# Patient Record
Sex: Male | Born: 1960 | Race: White | Hispanic: No | Marital: Married | Smoking: Never smoker
Health system: Southern US, Community
[De-identification: ages and names within clinical notes are randomized; demographics above are authoritative.]

## PROBLEM LIST (undated history)

## (undated) DIAGNOSIS — M25569 Pain in unspecified knee: Secondary | ICD-10-CM

## (undated) DIAGNOSIS — J3089 Other allergic rhinitis: Secondary | ICD-10-CM

## (undated) DIAGNOSIS — T4145XA Adverse effect of unspecified anesthetic, initial encounter: Secondary | ICD-10-CM

## (undated) DIAGNOSIS — M1990 Unspecified osteoarthritis, unspecified site: Secondary | ICD-10-CM

## (undated) DIAGNOSIS — T8859XA Other complications of anesthesia, initial encounter: Secondary | ICD-10-CM

## (undated) DIAGNOSIS — R51 Headache: Secondary | ICD-10-CM

## (undated) DIAGNOSIS — K219 Gastro-esophageal reflux disease without esophagitis: Secondary | ICD-10-CM

## (undated) HISTORY — PX: ESOPHAGOGASTRODUODENOSCOPY: SHX1529

## (undated) HISTORY — PX: TONSILLECTOMY: SUR1361

## (undated) HISTORY — PX: COLONOSCOPY: SHX174

## (undated) HISTORY — PX: OTHER SURGICAL HISTORY: SHX169

## (undated) HISTORY — PX: HEMORRHOID SURGERY: SHX153

---
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
## 2011-05-02 ENCOUNTER — Encounter (INDEPENDENT_AMBULATORY_CARE_PROVIDER_SITE_OTHER): Payer: Self-pay | Admitting: *Deleted

## 2011-05-23 ENCOUNTER — Ambulatory Visit (INDEPENDENT_AMBULATORY_CARE_PROVIDER_SITE_OTHER): Payer: 59 | Admitting: Internal Medicine

## 2011-05-23 ENCOUNTER — Encounter (INDEPENDENT_AMBULATORY_CARE_PROVIDER_SITE_OTHER): Payer: Self-pay | Admitting: Internal Medicine

## 2011-05-23 DIAGNOSIS — J45909 Unspecified asthma, uncomplicated: Secondary | ICD-10-CM | POA: Insufficient documentation

## 2011-05-23 DIAGNOSIS — R1310 Dysphagia, unspecified: Secondary | ICD-10-CM | POA: Insufficient documentation

## 2011-05-23 NOTE — Patient Instructions (Signed)
EGD/ED with Dr. Rehman. The risks and benefits such as perforation, bleeding, and infection were reviewed with the patient and is agreeable. 

## 2011-05-23 NOTE — Progress Notes (Signed)
Subjective:     [name], male   DOB: [birth year], 51 y.o.   MRN: [id number]  HPI Calvin Burns is a 51 yr old male today c/o dysphagia. His symptoms started within the past couple of months.  Meats and rice will lodge.  He has similar symptoms in the past. He underwent and EGD/ED by Dr Karilyn Cota 1 1/2 yrs for dysphagia and was dilated.  Appetite is good. No weight loss. No esophageal pain. No abdominal pain. No abdominal pain. Usually has a BM once a day. No melena or bright red rectal bleeding.   Review of Systems see hpi Current Outpatient Prescriptions  Medication Sig Dispense Refill  . albuterol (PROVENTIL HFA;VENTOLIN HFA) 108 (90 BASE) MCG/ACT inhaler Inhale 2 puffs into the lungs every 6 (six) hours as needed.      . Fluticasone-Salmeterol (ADVAIR) 250-50 MCG/DOSE AEPB Inhale 1 puff into the lungs every 12 (twelve) hours.       Past Medical History  Diagnosis Date  . Dysphagia   . Asthma    Past Surgical History  Procedure Date  . Hemorlroids     14 yr ago.  . Esophagogastroduodenoscopy     1 yr ago for dysphagia   History   Social History  . Marital Status: Divorced    Spouse Name: N/A    Number of Children: N/A  . Years of Education: N/A   Occupational History  . Not on file.   Social History Main Topics  . Smoking status: Never Smoker   . Smokeless tobacco: Not on file  . Alcohol Use: No     3 beer a day  . Drug Use: No  . Sexually Active: Not on file   Other Topics Concern  . Not on file   Social History Narrative  . No narrative on file   Family Status  Relation Status Death Age  . Mother Alive     good health  . Father Deceased     Esophageal cancer  . Sister Alive     Diabetes.  . Brother Alive     good health   History   Social History  . Marital Status: Divorced    Spouse Name: N/A    Number of Children: N/A  . Years of Education: N/A   Occupational History  . Not on file.   Social History Main Topics  . Smoking status:  Never Smoker   . Smokeless tobacco: Not on file  . Alcohol Use: No     3 beer a day  . Drug Use: No  . Sexually Active: Not on file   Other Topics Concern  . Not on file   Social History Narrative  . No narrative on file   No Known Allergies      Objective:   Physical Exam Filed Vitals:   05/23/11 1558  Height: 6\' 2"  (1.88 m)  Weight: 177 lb 3.2 oz (80.377 kg)  Alert and oriented. Skin warm and dry. Oral mucosa is moist.   . Sclera anicteric, conjunctivae is pink. Thyroid not enlarged. No cervical lymphadenopathy. Lungs clear. Heart regular rate and rhythm.  Abdomen is soft. Bowel sounds are positive. No hepatomegaly. No abdominal masses felt. No tenderness.  No edema to lower extremities. Patient is alert and oriented.      Assessment:    Dysphagia: solid food; Esophageal stricture needs to be ruled out.    Plan:    EGD/ED

## 2011-10-15 ENCOUNTER — Other Ambulatory Visit (INDEPENDENT_AMBULATORY_CARE_PROVIDER_SITE_OTHER): Payer: Self-pay | Admitting: *Deleted

## 2011-10-15 ENCOUNTER — Encounter (INDEPENDENT_AMBULATORY_CARE_PROVIDER_SITE_OTHER): Payer: Self-pay | Admitting: *Deleted

## 2011-10-15 DIAGNOSIS — R1310 Dysphagia, unspecified: Secondary | ICD-10-CM

## 2011-11-07 ENCOUNTER — Telehealth (INDEPENDENT_AMBULATORY_CARE_PROVIDER_SITE_OTHER): Payer: Self-pay | Admitting: *Deleted

## 2011-11-07 NOTE — Telephone Encounter (Signed)
PCP/Requesting MD: sasser  Name & DOB: Tyrail Vaca 01/16/1961     Procedure: egd/ed  Reason/Indication:  dysphagia  Has patient had this procedure before?  yes  If so, when, by whom and where?    Is there a family history of colon cancer?    Who?  What age when diagnosed?    Is patient diabetic?   no      Does patient have prosthetic heart valve?  no  Do you have a pacemaker?  no  Has patient had joint replacement within last 12 months?  no  Is patient on Coumadin, Plavix and/or Aspirin? no  Medications: see EPIC  Allergies: nkda  Medication Adjustment:   Procedure date & time: 12/06/11 at 730

## 2011-11-09 NOTE — Telephone Encounter (Signed)
agree

## 2011-11-19 ENCOUNTER — Encounter (HOSPITAL_COMMUNITY): Payer: Self-pay | Admitting: Pharmacy Technician

## 2011-12-19 ENCOUNTER — Telehealth (INDEPENDENT_AMBULATORY_CARE_PROVIDER_SITE_OTHER): Payer: Self-pay | Admitting: *Deleted

## 2011-12-19 NOTE — Telephone Encounter (Signed)
PCP/Requesting MD: sasser   Name & DOB: Calvin Burns December 14, 1960   Procedure: egd/ed  Reason/Indication:  dysphagia  Has patient had this procedure before?  yes  If so, when, by whom and where?    Is there a family history of colon cancer?    Who?  What age when diagnosed?    Is patient diabetic?   no      Does patient have prosthetic heart valve?  no  Do you have a pacemaker?  no  Has patient had joint replacement within last 12 months?  no  Is patient on Coumadin, Plavix and/or Aspirin? no  Medications: see EPIC  Allergies: nkda  Medication Adjustment:   Procedure date & time: 01/10/12 at 930

## 2011-12-20 NOTE — Telephone Encounter (Signed)
agree

## 2012-01-07 MED ORDER — BUPIVACAINE HCL (PF) 0.5 % IJ SOLN
INTRAMUSCULAR | Status: AC
  Filled 2012-01-07: qty 30

## 2012-01-09 MED ORDER — SODIUM CHLORIDE 0.45 % IV SOLN
Freq: Once | INTRAVENOUS | Status: AC
  Administered 2012-01-10: 09:00:00 via INTRAVENOUS

## 2012-01-10 ENCOUNTER — Encounter (HOSPITAL_COMMUNITY): Payer: Self-pay | Admitting: *Deleted

## 2012-01-10 ENCOUNTER — Encounter (HOSPITAL_COMMUNITY)
Admission: RE | Disposition: A | Discharge status: Home / Self Care | Payer: Self-pay | Source: Ambulatory Visit | Attending: Internal Medicine

## 2012-01-10 ENCOUNTER — Ambulatory Visit (HOSPITAL_COMMUNITY)
Admission: RE | Admit: 2012-01-10 | Discharge: 2012-01-10 | Disposition: A | Discharge status: Home / Self Care | Payer: 59 | Source: Ambulatory Visit | Attending: Internal Medicine | Admitting: Internal Medicine

## 2012-01-10 DIAGNOSIS — B3781 Candidal esophagitis: Secondary | ICD-10-CM | POA: Insufficient documentation

## 2012-01-10 DIAGNOSIS — R1310 Dysphagia, unspecified: Secondary | ICD-10-CM

## 2012-01-10 DIAGNOSIS — K449 Diaphragmatic hernia without obstruction or gangrene: Secondary | ICD-10-CM | POA: Insufficient documentation

## 2012-01-10 DIAGNOSIS — K228 Other specified diseases of esophagus: Secondary | ICD-10-CM

## 2012-01-10 DIAGNOSIS — K208 Other esophagitis: Secondary | ICD-10-CM

## 2012-01-10 DIAGNOSIS — K222 Esophageal obstruction: Secondary | ICD-10-CM

## 2012-01-10 DIAGNOSIS — R12 Heartburn: Secondary | ICD-10-CM

## 2012-01-10 HISTORY — PX: ESOPHAGOGASTRODUODENOSCOPY (EGD) WITH ESOPHAGEAL DILATION: SHX5812

## 2012-01-10 HISTORY — DX: Headache: R51

## 2012-01-10 SURGERY — ESOPHAGOGASTRODUODENOSCOPY (EGD) WITH ESOPHAGEAL DILATION
Anesthesia: Moderate Sedation

## 2012-01-10 MED ORDER — BUTAMBEN-TETRACAINE-BENZOCAINE 2-2-14 % EX AERO
INHALATION_SPRAY | CUTANEOUS | Status: DC | PRN
  Administered 2012-01-10: 2 via TOPICAL

## 2012-01-10 MED ORDER — PANTOPRAZOLE SODIUM 40 MG PO TBEC: 40.0000 mg | DELAYED_RELEASE_TABLET | Freq: Two times a day (BID) | ORAL | Status: DC

## 2012-01-10 MED ORDER — MEPERIDINE HCL 50 MG/ML IJ SOLN
INTRAMUSCULAR | Status: AC
  Filled 2012-01-10: qty 1

## 2012-01-10 MED ORDER — STERILE WATER FOR IRRIGATION IR SOLN
Status: DC | PRN
  Administered 2012-01-10: 09:00:00

## 2012-01-10 MED ORDER — MIDAZOLAM HCL 5 MG/5ML IJ SOLN
INTRAMUSCULAR | Status: AC
  Filled 2012-01-10: qty 10

## 2012-01-10 MED ORDER — MIDAZOLAM HCL 5 MG/5ML IJ SOLN
INTRAMUSCULAR | Status: DC | PRN
  Administered 2012-01-10 (×4): 2 mg via INTRAVENOUS
  Administered 2012-01-10 (×2): 1 mg via INTRAVENOUS

## 2012-01-10 MED ORDER — NYSTATIN NICU ORAL SYRINGE 100,000 UNITS/ML: 5.0000 mL | Freq: Four times a day (QID) | OROMUCOSAL | Status: DC

## 2012-01-10 MED ORDER — MEPERIDINE HCL 25 MG/ML IJ SOLN
INTRAMUSCULAR | Status: DC | PRN
  Administered 2012-01-10 (×2): 25 mg via INTRAVENOUS

## 2012-01-10 NOTE — H&P (Signed)
Calvin Burns is an 51 y.o. male.   Chief Complaint: Patient is here for EGD and ED. HPI: Patient is 51 year old Caucasian male who presents with few months history of solid food dysphagia. He has history of erosive esophagitis and esophageal stricture. Stricture was last dilated in June 2011 to 16.5 mm. He has not been taking his PPI. He is having heartburn couple of times a week. He denies anorexia weight loss abdominal pain or melena.  Past Medical History  Diagnosis Date  . Dysphagia   . Asthma   . Headache     History of migraines    Past Surgical History  Procedure Date  . Hemorlroids     14 yr ago.  . Esophagogastroduodenoscopy     1 yr ago for dysphagia  . Hemorrhoid surgery   . Colonoscopy     X 2    Family History  Problem Relation Age of Onset  . Colon cancer Other    Social History:  reports that he has never smoked. He does not have any smokeless tobacco history on file. He reports that he drinks about 16.8 ounces of alcohol per week. He reports that he does not use illicit drugs.  Allergies: No Known Allergies  Medications Prior to Admission  Medication Sig Dispense Refill  . Fluticasone-Salmeterol (ADVAIR) 250-50 MCG/DOSE AEPB Inhale 1 puff into the lungs every 12 (twelve) hours.      . naproxen (NAPROSYN) 500 MG tablet Take 500 mg by mouth daily as needed. Pain      . naproxen sodium (ALEVE) 220 MG tablet Take 220 mg by mouth 2 (two) times daily as needed. Pain      . albuterol (PROVENTIL HFA;VENTOLIN HFA) 108 (90 BASE) MCG/ACT inhaler Inhale 2 puffs into the lungs every 6 (six) hours as needed. Shortness of Breath        No results found for this or any previous visit (from the past 48 hour(s)). No results found.  ROS  Blood pressure 138/94, pulse 72, temperature 97.6 F (36.4 C), temperature source Oral, resp. rate 13, height 6\' 2"  (1.88 m), weight 177 lb (80.287 kg), SpO2 98.00%. Physical Exam  Constitutional: He appears well-developed and  well-nourished.  HENT:  Mouth/Throat: Oropharynx is clear and moist.  Eyes: Conjunctivae normal are normal. No scleral icterus.  Neck: No thyromegaly present.  Cardiovascular: Normal rate, regular rhythm and normal heart sounds.   No murmur heard. Respiratory: Effort normal and breath sounds normal.  GI: Soft. He exhibits no distension and no mass. There is no tenderness.  Musculoskeletal: He exhibits no edema.  Lymphadenopathy:    He has no cervical adenopathy.  Neurological: He is alert.  Skin: Skin is warm and dry.     Assessment/Plan Dysphagia and patient with chronic GERD and esophageal stricture. EGD with ED.  REHMAN,NAJEEB U 01/10/2012, 9:27 AM

## 2012-01-10 NOTE — Op Note (Signed)
EGD PROCEDURE REPORT  PATIENT:  Calvin Burns  MR#:  [id number] Birthdate:  May 06, 1960, 51 y.o., male Endoscopist:  Dr. Malissa Hippo, MD Referred By:  Dr. Estanislado Pandy, MD Procedure Date: 01/10/2012  Procedure:   EGD with ED.  Indications:  Patient is 51 year old Caucasian male who presents with solid food dysphagia and frequent heartburn. He has history of erosive esophagitis and distal esophageal stricture which was last dilated in June 2011 at [facility] to 16.5 mm. He has not been taking  PPI until recent office visit.            Informed Consent:  The risks, benefits, alternatives & imponderables which include, but are not limited to, bleeding, infection, perforation, drug reaction and potential missed lesion have been reviewed.  The potential for biopsy, lesion removal, esophageal dilation, etc. have also been discussed.  Questions have been answered.  All parties agreeable.  Please see history & physical in medical record for more information.  Medications:  Demerol 50 mg IV Versed 10 mg IV Cetacaine spray topically for oropharyngeal anesthesia  Description of procedure:  The endoscope was introduced through the mouth and advanced to the second portion of the duodenum without difficulty or limitations. The mucosal surfaces were surveyed very carefully during advancement of the scope and upon withdrawal.  Findings:  Esophagus: Patchy cheesy exudate noted on the mucosa of proximal and middle segment. In the mid and distal esophagus were circumferential rings without critical narrowing. No erosions identified. Stricture noted at GE junction. I was able to pass scope across it without any difficulty. GEJ:  38cm Hiatus:  41 cm Stomach:  Stomach was empty and distended very well with insufflation. Folds in the proximal stomach were normal. Examination of mucosa at body, antrum, pyloric channel as well as angularis, fundus and cardia was normal. Duodenum:  Normal bulbar and post bulbar  mucosa.  Therapeutic/Diagnostic Maneuvers Performed:  Distal esophageal stricture was dilated with a balloon dilator. Lumen dilator was passed with the scope. Guidewire was pushed into gastric lumen. Balloon dilator was positioned across esophageal stricture and slowly insufflated to a diameter of 15.5 and then to 16.5 and finally to 18 mm resulting in mucosal disruption. On the way out a biopsy was taken from esophageal mucosa looking for changes of eosinophilic esophagitis.  Complications:  None  Impression: Mild changes of Candida esophagitis. Circumferential rings at esophageal body without narrowing suspicious for eosinophilic esophagitis. Biopsy taken post dilation. Stricture at GEJ dilated with  balloon from 15 to74mm. Small sliding hiatal hernia.  Recommendations:  Soft diet for 24 hours. Pantoprazole 40 mg by mouth twice a day. Mycostatin suspension 500,000 units swish and swallow 4 times a day for 2 weeks  Silvio Sausedo U  01/10/2012  10:00 AM  CC: Dr. Estanislado Pandy, MD & Dr. Bonnetta Barry ref. provider found

## 2012-07-22 ENCOUNTER — Ambulatory Visit (INDEPENDENT_AMBULATORY_CARE_PROVIDER_SITE_OTHER): Payer: 59 | Admitting: Urology

## 2012-07-22 DIAGNOSIS — N4601 Organic azoospermia: Secondary | ICD-10-CM

## 2012-11-03 ENCOUNTER — Other Ambulatory Visit (INDEPENDENT_AMBULATORY_CARE_PROVIDER_SITE_OTHER): Payer: Self-pay | Admitting: Internal Medicine

## 2012-11-03 DIAGNOSIS — K219 Gastro-esophageal reflux disease without esophagitis: Secondary | ICD-10-CM

## 2012-11-03 MED ORDER — PANTOPRAZOLE SODIUM 40 MG PO TBEC: 40.0000 mg | DELAYED_RELEASE_TABLET | Freq: Every day | ORAL | Status: DC

## 2012-11-06 ENCOUNTER — Other Ambulatory Visit (INDEPENDENT_AMBULATORY_CARE_PROVIDER_SITE_OTHER): Payer: Self-pay | Admitting: Internal Medicine

## 2012-11-06 DIAGNOSIS — K219 Gastro-esophageal reflux disease without esophagitis: Secondary | ICD-10-CM

## 2012-11-06 MED ORDER — PANTOPRAZOLE SODIUM 40 MG PO TBEC: 40.0000 mg | DELAYED_RELEASE_TABLET | Freq: Every day | ORAL | Status: DC

## 2012-11-18 ENCOUNTER — Other Ambulatory Visit (INDEPENDENT_AMBULATORY_CARE_PROVIDER_SITE_OTHER): Payer: Self-pay | Admitting: Internal Medicine

## 2012-11-18 DIAGNOSIS — K219 Gastro-esophageal reflux disease without esophagitis: Secondary | ICD-10-CM

## 2012-11-18 MED ORDER — PANTOPRAZOLE SODIUM 40 MG PO TBEC: 40.0000 mg | DELAYED_RELEASE_TABLET | Freq: Every day | ORAL | Status: DC

## 2012-11-24 ENCOUNTER — Other Ambulatory Visit (INDEPENDENT_AMBULATORY_CARE_PROVIDER_SITE_OTHER): Payer: Self-pay | Admitting: Internal Medicine

## 2012-11-24 DIAGNOSIS — K219 Gastro-esophageal reflux disease without esophagitis: Secondary | ICD-10-CM

## 2013-02-04 ENCOUNTER — Encounter (INDEPENDENT_AMBULATORY_CARE_PROVIDER_SITE_OTHER): Payer: Self-pay | Admitting: *Deleted

## 2013-04-23 ENCOUNTER — Other Ambulatory Visit (INDEPENDENT_AMBULATORY_CARE_PROVIDER_SITE_OTHER): Payer: Self-pay | Admitting: Internal Medicine

## 2013-04-23 DIAGNOSIS — K219 Gastro-esophageal reflux disease without esophagitis: Secondary | ICD-10-CM

## 2013-04-23 MED ORDER — PANTOPRAZOLE SODIUM 40 MG PO TBEC: 40.0000 mg | DELAYED_RELEASE_TABLET | Freq: Every day | ORAL | Status: DC

## 2013-04-27 ENCOUNTER — Telehealth (INDEPENDENT_AMBULATORY_CARE_PROVIDER_SITE_OTHER): Payer: Self-pay | Admitting: Internal Medicine

## 2013-04-27 DIAGNOSIS — K219 Gastro-esophageal reflux disease without esophagitis: Secondary | ICD-10-CM

## 2013-04-27 MED ORDER — PANTOPRAZOLE SODIUM 40 MG PO TBEC: 40.0000 mg | DELAYED_RELEASE_TABLET | Freq: Every day | ORAL | Status: DC

## 2013-04-27 NOTE — Telephone Encounter (Signed)
Eprescribed.

## 2013-05-25 ENCOUNTER — Other Ambulatory Visit (INDEPENDENT_AMBULATORY_CARE_PROVIDER_SITE_OTHER): Payer: Self-pay | Admitting: Internal Medicine

## 2013-05-25 DIAGNOSIS — K219 Gastro-esophageal reflux disease without esophagitis: Secondary | ICD-10-CM

## 2013-05-25 MED ORDER — PANTOPRAZOLE SODIUM 40 MG PO TBEC: 40.0000 mg | DELAYED_RELEASE_TABLET | Freq: Every day | ORAL | Status: DC

## 2013-06-23 ENCOUNTER — Ambulatory Visit (INDEPENDENT_AMBULATORY_CARE_PROVIDER_SITE_OTHER): Payer: 59 | Admitting: Internal Medicine

## 2013-07-22 ENCOUNTER — Encounter (INDEPENDENT_AMBULATORY_CARE_PROVIDER_SITE_OTHER): Payer: Self-pay | Admitting: Internal Medicine

## 2013-07-22 ENCOUNTER — Ambulatory Visit (INDEPENDENT_AMBULATORY_CARE_PROVIDER_SITE_OTHER): Payer: 59 | Admitting: Internal Medicine

## 2013-07-22 VITALS — BP 108/72 | HR 76 | Temp 98.3°F | Ht 74.0 in | Wt 175.1 lb

## 2013-07-22 DIAGNOSIS — K219 Gastro-esophageal reflux disease without esophagitis: Secondary | ICD-10-CM

## 2013-07-22 MED ORDER — PANTOPRAZOLE SODIUM 40 MG PO TBEC: 40.0000 mg | DELAYED_RELEASE_TABLET | Freq: Every day | ORAL | Status: DC

## 2013-07-22 NOTE — Patient Instructions (Signed)
OV in 1 yr. Continue the Protonix 

## 2013-07-22 NOTE — Progress Notes (Signed)
Subjective:     [name], male   DOB: 1960/04/16, 53 y.o.   MRN: [id number]  HPI Here today for f/u. He tells me he is doing good. He takes his Protonix daily. Protonix is controlling his acid reflux. Appetite is good. No weight loss. No dysphagia. No abdominal pain. BMs are normal. No melena or bright red rectal bleeding.  01/10/2012 EGD/ED:  Impression:  Mild changes of Candida esophagitis.  Circumferential rings at esophageal body without narrowing suspicious for eosinophilic esophagitis. Biopsy taken post dilation.  Stricture at GEJ dilated with balloon from 15 to31mm.  Small sliding hiatal hernia.     Review of Systems see hpi  Past Medical History  Diagnosis Date  . Dysphagia   . Asthma   . Headache(784.0)     History of migraines    Past Surgical History  Procedure Laterality Date  . Hemorlroids      14 yr ago.  . Esophagogastroduodenoscopy      1 yr ago for dysphagia  . Hemorrhoid surgery    . Colonoscopy      X 2  . Esophagogastroduodenoscopy (egd) with esophageal dilation  01/10/2012    Procedure: ESOPHAGOGASTRODUODENOSCOPY (EGD) WITH ESOPHAGEAL DILATION;  Surgeon: Rogene Houston, MD;  Location: [facility];  Service: Endoscopy;  Laterality: N/A;  930    No Known Allergies  Current Outpatient Prescriptions on File Prior to Visit  Medication Sig Dispense Refill  . albuterol (PROVENTIL HFA;VENTOLIN HFA) 108 (90 BASE) MCG/ACT inhaler Inhale 2 puffs into the lungs every 6 (six) hours as needed. Shortness of Breath      . Fluticasone-Salmeterol (ADVAIR) 250-50 MCG/DOSE AEPB Inhale 1 puff into the lungs every 12 (twelve) hours.      . naproxen (NAPROSYN) 500 MG tablet Take 500 mg by mouth daily as needed. Pain       No current facility-administered medications on file prior to visit.        Objective:   Physical Exam  Filed Vitals:   07/22/13 1535  BP: 108/72  Pulse: 76  Temp: 98.3 F (36.8 C)  Height: 6\' 2"  (1.88 m)  Weight: 175  lb 1.6 oz (79.425 kg)   Alert and oriented. Skin warm and dry. Oral mucosa is moist.   . Sclera anicteric, conjunctivae is pink. Thyroid not enlarged. No cervical lymphadenopathy. Lungs clear. Heart regular rate and rhythm.  Abdomen is soft. Bowel sounds are positive. No hepatomegaly. No abdominal masses felt. No tenderness.  No edema to lower extremities. Patient is alert and oriented.     Assessment:    GERD. Controlled at this time. No dysphagia.     Plan:    OV in 1 yr. Continue the Protonix.

## 2014-04-22 ENCOUNTER — Encounter (INDEPENDENT_AMBULATORY_CARE_PROVIDER_SITE_OTHER): Payer: Self-pay | Admitting: *Deleted

## 2014-07-26 ENCOUNTER — Ambulatory Visit (INDEPENDENT_AMBULATORY_CARE_PROVIDER_SITE_OTHER): Payer: 59 | Admitting: Internal Medicine

## 2014-07-26 ENCOUNTER — Encounter (INDEPENDENT_AMBULATORY_CARE_PROVIDER_SITE_OTHER): Payer: Self-pay | Admitting: Internal Medicine

## 2014-07-26 VITALS — BP 110/82 | HR 78 | Temp 98.9°F | Resp 18 | Ht 74.0 in | Wt 175.1 lb

## 2014-07-26 DIAGNOSIS — K2100 Gastro-esophageal reflux disease with esophagitis, without bleeding: Secondary | ICD-10-CM

## 2014-07-26 DIAGNOSIS — Z8719 Personal history of other diseases of the digestive system: Secondary | ICD-10-CM

## 2014-07-26 MED ORDER — PANTOPRAZOLE SODIUM 40 MG PO TBEC: 40.0000 mg | DELAYED_RELEASE_TABLET | Freq: Every day | ORAL | Status: DC

## 2014-07-26 NOTE — Patient Instructions (Signed)
Call if you have swallowing difficulty or pantoprazole quits working.

## 2014-07-26 NOTE — Progress Notes (Signed)
Presenting complaint;  Follow-up for chronic GERD.  Subjective:  Patient is 54 year old Caucasian male who is here for yearly visit. He has history of erosive reflux esophagitis complicated by esophageal stricture who has undergone esophageal dilation in 2011 and again in October 2013. He states he's feeling fine. He has no dysphagia whatsoever. He eats slowly and chews his food thoroughly. He also does not have heartburn as long as he takes his medication. However if he skips a dose he starts having heartburn and regurgitation within 36 hours. His bowels move daily. He denies melena or rectal bleeding. He he works in his yard and [employer]. His last colonoscopy was in June 2010.  Family history significant for esophageal carcinoma in his father who was 59 at the time of diagnosis. Maternal grandmother was diagnosed with colon carcinoma at age 50 and died at 43.    Current Medications: Outpatient Encounter Prescriptions as of 07/26/2014  Medication Sig  . albuterol (PROVENTIL HFA;VENTOLIN HFA) 108 (90 BASE) MCG/ACT inhaler Inhale 2 puffs into the lungs every 6 (six) hours as needed. Shortness of Breath  . Fluticasone-Salmeterol (ADVAIR) 250-50 MCG/DOSE AEPB Inhale 1 puff into the lungs every 12 (twelve) hours.  . naproxen (NAPROSYN) 500 MG tablet Take 500 mg by mouth daily as needed. Pain  . pantoprazole (PROTONIX) 40 MG tablet Take 1 tablet (40 mg total) by mouth daily. Once a day before breakfast.   No facility-administered encounter medications on file as of 07/26/2014.     Objective: Blood pressure 110/82, pulse 78, temperature 98.9 F (37.2 C), temperature source Oral, resp. rate 18, height 6\' 2"  (1.88 m), weight 175 lb 1.6 oz (79.425 kg).  patient is alert and in no acute distress.  Conjunctiva is pink. Sclera is nonicteric Oropharyngeal mucosa is normal. No neck masses or thyromegaly noted. Cardiac exam with regular rhythm normal S1 and S2. No murmur or gallop  noted. Lungs are clear to auscultation. Abdomen is symmetrical soft and nontender without organomegaly or masses.   No LE edema or clubbing noted.    Assessment:  #1. Chronic GERD complicated by distal esophageal stricture which has been dilated in 2011 and more recently in October 2013. He is doing very well with therapy. He is not having dysphagia at all. #2. CRC risk deemed to be average. X colonoscopy in 2020.  Plan:  New prescription given for pantoprazole 40 mg by mouth every morning 90 doses with 3 refills. Office visit in one year unless pantoprazole stops working or he develops dysphagia.

## 2015-04-08 ENCOUNTER — Encounter (INDEPENDENT_AMBULATORY_CARE_PROVIDER_SITE_OTHER): Payer: Self-pay | Admitting: Internal Medicine

## 2015-07-28 ENCOUNTER — Ambulatory Visit (INDEPENDENT_AMBULATORY_CARE_PROVIDER_SITE_OTHER): Payer: 59 | Admitting: Internal Medicine

## 2015-09-07 ENCOUNTER — Encounter (INDEPENDENT_AMBULATORY_CARE_PROVIDER_SITE_OTHER): Payer: Self-pay | Admitting: Internal Medicine

## 2015-09-07 ENCOUNTER — Ambulatory Visit (INDEPENDENT_AMBULATORY_CARE_PROVIDER_SITE_OTHER): Payer: 59 | Admitting: Internal Medicine

## 2015-09-07 VITALS — BP 130/86 | HR 72 | Temp 98.1°F | Ht 74.0 in | Wt 175.9 lb

## 2015-09-07 DIAGNOSIS — K2100 Gastro-esophageal reflux disease with esophagitis, without bleeding: Secondary | ICD-10-CM

## 2015-09-07 MED ORDER — PANTOPRAZOLE SODIUM 40 MG PO TBEC: 40.0000 mg | DELAYED_RELEASE_TABLET | Freq: Every day | ORAL | Status: DC

## 2015-09-07 NOTE — Progress Notes (Signed)
   Subjective:    [name], male    DOB: 10-22-1960, 55 y.o.   MRN: [id number] F/u GERD. HPI Here today for f/u. He was last seen in May of last year by Dr. Laural Golden. Hx of erosive reflux esophagitis complicated by esophageal stricture. Underwent and ED in 2011 and 2013. Last colonoscopy in 2010.  Family hx significant for esophageal carcinoma in father who was 1 at time of diagnosis.  Maternal grandmother was diagnosed with colon cancer at age 43 and died at age 82. 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 He takes the Protonix daily. No dysphagia. Appetite is good. No weight loss.  BMs are nomal. No melena or BRRB.       Review of Systems Past Medical History  Diagnosis Date  . Dysphagia   . Asthma   . Headache(784.0)     History of migraines    Past Surgical History  Procedure Laterality Date  . Hemorlroids      14 yr ago.  . Esophagogastroduodenoscopy      1 yr ago for dysphagia  . Hemorrhoid surgery    . Colonoscopy      X 2  . Esophagogastroduodenoscopy (egd) with esophageal dilation  01/10/2012    Procedure: ESOPHAGOGASTRODUODENOSCOPY (EGD) WITH ESOPHAGEAL DILATION;  Surgeon: Rogene Houston, MD;  Location: [facility];  Service: Endoscopy;  Laterality: N/A;  930    No Known Allergies  Current Outpatient Prescriptions on File Prior to Visit  Medication Sig Dispense Refill  . albuterol (PROVENTIL HFA;VENTOLIN HFA) 108 (90 BASE) MCG/ACT inhaler Inhale 2 puffs into the lungs every 6 (six) hours as needed. Shortness of Breath    . Fluticasone-Salmeterol (ADVAIR) 250-50 MCG/DOSE AEPB Inhale 1 puff into the lungs every 12 (twelve) hours.    . naproxen (NAPROSYN) 500 MG tablet Take 500 mg by mouth daily as needed. Pain     No current facility-administered medications on file prior to visit.        Objective:   Physical ExamBlood pressure 130/86, pulse 72, temperature 98.1 F (36.7 C), height 6\' 2"  (1.88 m), weight 175 lb 14.4 oz (79.788 kg).  Alert and oriented.  Skin warm and dry. Oral mucosa is moist.   . Sclera anicteric, conjunctivae is pink. Thyroid not enlarged. No cervical lymphadenopathy. Lungs clear. Heart regular rate and rhythm.  Abdomen is soft. Bowel sounds are positive. No hepatomegaly. No abdominal masses felt. No tenderness.  No edema to lower extremities. P        Assessment & Plan:  GERD. He is doing well with therapy. He will continue Protonix. No dysphagia. Rx for Protonix sent to his pharmacy.  OV in 1 year. He will let me know when he is ready for a colonoscopy.

## 2015-09-07 NOTE — Patient Instructions (Signed)
Continue the Protonix. OV in 1 year.  

## 2016-08-13 ENCOUNTER — Encounter (INDEPENDENT_AMBULATORY_CARE_PROVIDER_SITE_OTHER): Payer: Self-pay | Admitting: Internal Medicine

## 2016-08-13 ENCOUNTER — Encounter (INDEPENDENT_AMBULATORY_CARE_PROVIDER_SITE_OTHER): Payer: Self-pay

## 2016-09-06 ENCOUNTER — Encounter (INDEPENDENT_AMBULATORY_CARE_PROVIDER_SITE_OTHER): Payer: Self-pay | Admitting: *Deleted

## 2016-09-06 ENCOUNTER — Telehealth (INDEPENDENT_AMBULATORY_CARE_PROVIDER_SITE_OTHER): Payer: Self-pay | Admitting: *Deleted

## 2016-09-06 ENCOUNTER — Ambulatory Visit (INDEPENDENT_AMBULATORY_CARE_PROVIDER_SITE_OTHER): Payer: 59 | Admitting: Internal Medicine

## 2016-09-06 ENCOUNTER — Encounter (INDEPENDENT_AMBULATORY_CARE_PROVIDER_SITE_OTHER): Payer: Self-pay | Admitting: Internal Medicine

## 2016-09-06 ENCOUNTER — Other Ambulatory Visit (INDEPENDENT_AMBULATORY_CARE_PROVIDER_SITE_OTHER): Payer: Self-pay | Admitting: Internal Medicine

## 2016-09-06 VITALS — BP 130/80 | HR 64 | Temp 98.3°F | Ht 73.0 in | Wt 169.0 lb

## 2016-09-06 DIAGNOSIS — R1310 Dysphagia, unspecified: Secondary | ICD-10-CM | POA: Diagnosis not present

## 2016-09-06 DIAGNOSIS — R1319 Other dysphagia: Secondary | ICD-10-CM

## 2016-09-06 DIAGNOSIS — K2100 Gastro-esophageal reflux disease with esophagitis, without bleeding: Secondary | ICD-10-CM

## 2016-09-06 DIAGNOSIS — Z800 Family history of malignant neoplasm of digestive organs: Secondary | ICD-10-CM | POA: Insufficient documentation

## 2016-09-06 MED ORDER — PEG 3350-KCL-NA BICARB-NACL 420 G PO SOLR: 4000.0000 mL | Freq: Once | ORAL | 0 refills | Status: AC

## 2016-09-06 NOTE — Progress Notes (Signed)
   Subjective:    [name], male    DOB: [birth year], 56 y.o.   MRN: [id number]  HPI  Her today for follow. Last seen in June of 2017 for f/u. Hx of erosive reflux esophagitis complicated by esophageal stricture. EGD/ED in 2011 and 2013.   Last colonoscopy in 2010.  Maternal grandmother diagnosed with colon cancer ant age 38 and died at age 39.  He tells me he is doing good. His acid reflux controlled with Protonix. No dysphagia. He has a BM daily. No melena or BRRb. Due for a colonoscopy.    Review of Systems Past Medical History:  Diagnosis Date  . Asthma   . Dysphagia   . Headache(784.0)    History of migraines    Past Surgical History:  Procedure Laterality Date  . COLONOSCOPY     X 2  . ESOPHAGOGASTRODUODENOSCOPY     1 yr ago for dysphagia  . ESOPHAGOGASTRODUODENOSCOPY (EGD) WITH ESOPHAGEAL DILATION  01/10/2012   Procedure: ESOPHAGOGASTRODUODENOSCOPY (EGD) WITH ESOPHAGEAL DILATION;  Surgeon: Rogene Houston, MD;  Location: [facility];  Service: Endoscopy;  Laterality: N/A;  930  . Hemorlroids     14 yr ago.  Marland Kitchen HEMORRHOID SURGERY      No Known Allergies  Current Outpatient Prescriptions on File Prior to Visit  Medication Sig Dispense Refill  . albuterol (PROVENTIL HFA;VENTOLIN HFA) 108 (90 BASE) MCG/ACT inhaler Inhale 2 puffs into the lungs every 6 (six) hours as needed. Shortness of Breath    . Fluticasone-Salmeterol (ADVAIR) 250-50 MCG/DOSE AEPB Inhale 1 puff into the lungs every 12 (twelve) hours.    . naproxen (NAPROSYN) 500 MG tablet Take 500 mg by mouth daily as needed. Pain    . pantoprazole (PROTONIX) 40 MG tablet Take 1 tablet (40 mg total) by mouth daily. Once a day before breakfast. 90 tablet 4   No current facility-administered medications on file prior to visit.         Objective:   Physical Exam Blood pressure 130/80, pulse 64, temperature 98.3 F (36.8 C), height 6\' 1"  (1.854 m), weight 169 lb (76.7 kg).  Alert and oriented.  Skin warm and dry. Oral mucosa is moist.   . Sclera anicteric, conjunctivae is pink. Thyroid not enlarged. No cervical lymphadenopathy. Lungs clear. Heart regular rate and rhythm.  Abdomen is soft. Bowel sounds are positive. No hepatomegaly. No abdominal masses felt. No tenderness.  No edema to lower extremities.  .        Assessment & Plan:  GERD/dysphagia. Controlled with Protonix. No problems with dysphagia. Family hx of colon cancer: Colonoscopy. The risks of bleeding, perforation and infection were reviewed with patient.

## 2016-09-06 NOTE — Telephone Encounter (Signed)
Patient needs trilyte 

## 2016-09-06 NOTE — Patient Instructions (Signed)
Colonoscopy. The risks of bleeding, perforation and infection were reviewed with patient.  

## 2016-10-10 DIAGNOSIS — R3121 Asymptomatic microscopic hematuria: Secondary | ICD-10-CM | POA: Diagnosis not present

## 2016-10-26 ENCOUNTER — Other Ambulatory Visit (INDEPENDENT_AMBULATORY_CARE_PROVIDER_SITE_OTHER): Payer: Self-pay | Admitting: Internal Medicine

## 2016-10-26 DIAGNOSIS — K2100 Gastro-esophageal reflux disease with esophagitis, without bleeding: Secondary | ICD-10-CM

## 2016-10-26 NOTE — Telephone Encounter (Signed)
Rx was escribed to patient's pharmacy ,patient has been made aware.

## 2016-11-14 DIAGNOSIS — Z9889 Other specified postprocedural states: Secondary | ICD-10-CM | POA: Diagnosis not present

## 2016-12-11 DIAGNOSIS — M1712 Unilateral primary osteoarthritis, left knee: Secondary | ICD-10-CM | POA: Diagnosis not present

## 2017-01-09 ENCOUNTER — Other Ambulatory Visit (INDEPENDENT_AMBULATORY_CARE_PROVIDER_SITE_OTHER): Payer: Self-pay | Admitting: *Deleted

## 2017-01-09 DIAGNOSIS — Z800 Family history of malignant neoplasm of digestive organs: Secondary | ICD-10-CM

## 2017-01-09 DIAGNOSIS — Z85038 Personal history of other malignant neoplasm of large intestine: Secondary | ICD-10-CM

## 2017-01-10 ENCOUNTER — Ambulatory Visit (HOSPITAL_COMMUNITY)
Admission: RE | Admit: 2017-01-10 | Discharge: 2017-01-10 | Disposition: A | Discharge status: Home / Self Care | Payer: 59 | Source: Ambulatory Visit | Attending: Internal Medicine | Admitting: Internal Medicine

## 2017-01-10 ENCOUNTER — Encounter (HOSPITAL_COMMUNITY)
Admission: RE | Disposition: A | Discharge status: Home / Self Care | Payer: Self-pay | Source: Ambulatory Visit | Attending: Internal Medicine

## 2017-01-10 ENCOUNTER — Encounter (HOSPITAL_COMMUNITY): Payer: Self-pay | Admitting: *Deleted

## 2017-01-10 DIAGNOSIS — J45909 Unspecified asthma, uncomplicated: Secondary | ICD-10-CM | POA: Diagnosis not present

## 2017-01-10 DIAGNOSIS — Z79899 Other long term (current) drug therapy: Secondary | ICD-10-CM | POA: Diagnosis not present

## 2017-01-10 DIAGNOSIS — K621 Rectal polyp: Secondary | ICD-10-CM | POA: Diagnosis not present

## 2017-01-10 DIAGNOSIS — D128 Benign neoplasm of rectum: Secondary | ICD-10-CM | POA: Diagnosis not present

## 2017-01-10 DIAGNOSIS — Z85038 Personal history of other malignant neoplasm of large intestine: Secondary | ICD-10-CM

## 2017-01-10 DIAGNOSIS — Z1211 Encounter for screening for malignant neoplasm of colon: Secondary | ICD-10-CM | POA: Diagnosis not present

## 2017-01-10 DIAGNOSIS — K648 Other hemorrhoids: Secondary | ICD-10-CM | POA: Diagnosis not present

## 2017-01-10 DIAGNOSIS — Z800 Family history of malignant neoplasm of digestive organs: Secondary | ICD-10-CM | POA: Diagnosis not present

## 2017-01-10 HISTORY — PX: POLYPECTOMY: SHX5525

## 2017-01-10 HISTORY — PX: COLONOSCOPY: SHX5424

## 2017-01-10 SURGERY — COLONOSCOPY
Anesthesia: Moderate Sedation

## 2017-01-10 MED ORDER — MEPERIDINE HCL 50 MG/ML IJ SOLN
INTRAMUSCULAR | Status: DC | PRN
  Administered 2017-01-10 (×2): 25 mg via INTRAVENOUS

## 2017-01-10 MED ORDER — SODIUM CHLORIDE 0.9 % IV SOLN
INTRAVENOUS | Status: DC
  Administered 2017-01-10: 09:00:00 via INTRAVENOUS

## 2017-01-10 MED ORDER — STERILE WATER FOR IRRIGATION IR SOLN
Status: DC | PRN
  Administered 2017-01-10: 10:00:00

## 2017-01-10 MED ORDER — MEPERIDINE HCL 50 MG/ML IJ SOLN
INTRAMUSCULAR | Status: AC
  Filled 2017-01-10: qty 1

## 2017-01-10 MED ORDER — MIDAZOLAM HCL 5 MG/5ML IJ SOLN
INTRAMUSCULAR | Status: AC
  Filled 2017-01-10: qty 10

## 2017-01-10 MED ORDER — MIDAZOLAM HCL 5 MG/5ML IJ SOLN
INTRAMUSCULAR | Status: DC | PRN
  Administered 2017-01-10 (×4): 2 mg via INTRAVENOUS

## 2017-01-10 NOTE — Op Note (Signed)
[facility] Patient Name: Calvin Burns Procedure Date: 01/10/2017 9:47 AM MRN: [id number] Date of Birth: 02/15/1961 Attending MD: Hildred Laser , MD CSN: [id number] Age: 56 Admit Type: Outpatient Procedure:                Colonoscopy Indications:              Screening for colorectal malignant neoplasm Providers:                Hildred Laser, MD, Otis Peak B. Sharon Seller, RN, Selena Lesser, Randa Spike, Technician Referring MD:             Manon Hilding, MD Medicines:                Meperidine 50 mg IV, Midazolam 8 mg IV Complications:            No immediate complications. Estimated Blood Loss:     Estimated blood loss was minimal. Procedure:                Pre-Anesthesia Assessment:                           - Prior to the procedure, a History and Physical                            was performed, and patient medications and                            allergies were reviewed. The patient's tolerance of                            previous anesthesia was also reviewed. The risks                            and benefits of the procedure and the sedation                            options and risks were discussed with the patient.                            All questions were answered, and informed consent                            was obtained. Prior Anticoagulants: The patient                            last took previous NSAID medication 5 days prior to                            the procedure. ASA Grade Assessment: II - A patient                            with mild systemic disease. After reviewing the  risks and benefits, the patient was deemed in                            satisfactory condition to undergo the procedure.                           After obtaining informed consent, the colonoscope                            was passed under direct vision. Throughout the                            procedure, the patient's  blood pressure, pulse, and                            oxygen saturations were monitored continuously. The                            EC-[id number] ([id number]) scope was introduced through                            the anus and advanced to the the cecum, identified                            by appendiceal orifice and ileocecal valve. The                            colonoscopy was somewhat difficult due to a                            tortuous colon. The patient tolerated the procedure                            well. The quality of the bowel preparation was                            good. The ileocecal valve, appendiceal orifice, and                            rectum were photographed. Scope In: 10:27:33 AM Scope Out: 10:54:49 AM Scope Withdrawal Time: 0 hours 9 minutes 19 seconds  Total Procedure Duration: 0 hours 27 minutes 16 seconds  Findings:      The perianal and digital rectal examinations were normal.      The recto-sigmoid colon, sigmoid colon, descending colon, splenic       flexure, transverse colon, hepatic flexure, ascending colon, cecum,       appendiceal orifice and ileocecal valve appeared normal.      A 5 mm polyp was found in the rectum. The polyp was removed with a cold       snare. Resection and retrieval were complete.      Internal hemorrhoids were found during retroflexion. The hemorrhoids       were small. Impression:               - The recto-sigmoid colon,  sigmoid colon,                            descending colon, splenic flexure, transverse                            colon, hepatic flexure, ascending colon, cecum,                            appendiceal orifice and ileocecal valve are normal.                           - One 5 mm polyp in the rectum, removed with a cold                            snare. Resected and retrieved.                           - Internal hemorrhoids. Moderate Sedation:      Moderate (conscious) sedation was administered by the endoscopy  nurse       and supervised by the endoscopist. The following parameters were       monitored: oxygen saturation, heart rate, blood pressure, CO2       capnography and response to care. Total physician intraservice time was       33 minutes. Recommendation:           - Patient has a contact number available for                            emergencies. The signs and symptoms of potential                            delayed complications were discussed with the                            patient. Return to normal activities tomorrow.                            Written discharge instructions were provided to the                            patient.                           - Resume previous diet today.                           - Continue present medications.                           - Await pathology results.                           - No aspirin, ibuprofen, naproxen, or other  non-steroidal anti-inflammatory drugs for 1 day.                           - Repeat colonoscopy date to be determined after                            pending pathology results are reviewed. Procedure Code(s):        --- Professional ---                           [phone], Colonoscopy, flexible; with removal of                            tumor(s), polyp(s), or other lesion(s) by snare                            technique                           99152, Moderate sedation services provided by the                            same physician or other qualified health care                            professional performing the diagnostic or                            therapeutic service that the sedation supports,                            requiring the presence of an independent trained                            observer to assist in the monitoring of the                            patient's level of consciousness and physiological                            status; initial 15 minutes of  intraservice time,                            patient age 43 years or older                           [phone], Moderate sedation services; each additional                            15 minutes intraservice time Diagnosis Code(s):        --- Professional ---                           Z12.11, Encounter for screening for malignant  neoplasm of colon                           K64.8, Other hemorrhoids                           K62.1, Rectal polyp CPT copyright 2016 [employer]. All rights reserved. The codes documented in this report are preliminary and upon coder review may  be revised to meet current compliance requirements. Hildred Laser, MD Hildred Laser, MD 01/10/2017 11:02:57 AM This report has been signed electronically. Number of Addenda: 0

## 2017-01-10 NOTE — Discharge Instructions (Signed)
Resume usual medications and diet as before. °No driving for 24 hours.   °Physician will call with biopsy results. ° ° °Colonoscopy, Adult, Care After °This sheet gives you information about how to care for yourself after your procedure. Your doctor may also give you more specific instructions. If you have problems or questions, call your doctor. °Follow these instructions at home: °General instructions ° °· For the first 24 hours after the procedure: °? Do not drive or use machinery. °? Do not sign important documents. °? Do not drink alcohol. °? Do your daily activities more slowly than normal. °? Eat foods that are soft and easy to digest. °? Rest often. °· Take over-the-counter or prescription medicines only as told by your doctor. °· It is up to you to get the results of your procedure. Ask your doctor, or the department performing the procedure, when your results will be ready. °To help cramping and bloating: °· Try walking around. °· Put heat on your belly (abdomen) as told by your doctor. Use a heat source that your doctor recommends, such as a moist heat pack or a heating pad. °? Put a towel between your skin and the heat source. °? Leave the heat on for 20-30 minutes. °? Remove the heat if your skin turns bright red. This is especially important if you cannot feel pain, heat, or cold. You can get burned. °Eating and drinking °· Drink enough fluid to keep your pee (urine) clear or pale yellow. °· Return to your normal diet as told by your doctor. Avoid heavy or fried foods that are hard to digest. °· Avoid drinking alcohol for as long as told by your doctor. °Contact a doctor if: °· You have blood in your poop (stool) 2-3 days after the procedure. °Get help right away if: °· You have more than a small amount of blood in your poop. °· You see large clumps of tissue (blood clots) in your poop. °· Your belly is swollen. °· You feel sick to your stomach (nauseous). °· You throw up (vomit). °· You have a  fever. °· You have belly pain that gets worse, and medicine does not help your pain. °This information is not intended to replace advice given to you by your health care provider. Make sure you discuss any questions you have with your health care provider. °Document Released: 03/31/2010 Document Revised: 11/21/2015 Document Reviewed: 11/21/2015 °Elsevier Interactive Patient Education © 2017 [employer] ° °Colon Polyps °Polyps are tissue growths inside the body. Polyps can grow in many places, including the large intestine (colon). A polyp may be a round bump or a mushroom-shaped growth. You could have one polyp or several. °Most colon polyps are noncancerous (benign). However, some colon polyps can become cancerous over time. °What are the causes? °The exact cause of colon polyps is not known. °What increases the risk? °This condition is more likely to develop in people who: °· Have a family history of colon cancer or colon polyps. °· Are older than 50 or older than 45 if they are African American. °· Have inflammatory bowel disease, such as ulcerative colitis or Crohn disease. °· Are overweight. °· Smoke cigarettes. °· Do not get enough exercise. °· Drink too much alcohol. °· Eat a diet that is: °? High in fat and red meat. °? Low in fiber. °· Had childhood cancer that was treated with abdominal radiation. ° °What are the signs or symptoms? °Most polyps do not cause symptoms. If you have symptoms, they   may include: °· Blood coming from your rectum when having a bowel movement. °· Blood in your stool. The stool may look dark red or black. °· A change in bowel habits, such as constipation or diarrhea. ° °How is this diagnosed? °This condition is diagnosed with a colonoscopy. This is a procedure that uses a lighted, flexible scope to look at the inside of your colon. °How is this treated? °Treatment for this condition involves removing any polyps that are found. Those polyps will then be tested for cancer. If cancer  is found, your health care provider will talk to you about options for colon cancer treatment. °Follow these instructions at home: °Diet °· Eat plenty of fiber, such as fruits, vegetables, and whole grains. °· Eat foods that are high in calcium and vitamin D, such as milk, cheese, yogurt, eggs, liver, fish, and broccoli. °· Limit foods high in fat, red meats, and processed meats, such as hot dogs, sausage, bacon, and lunch meats. °· Maintain a healthy weight, or lose weight if recommended by your health care provider. °General instructions °· Do not smoke cigarettes. °· Do not drink alcohol excessively. °· Keep all follow-up visits as told by your health care provider. This is important. This includes keeping regularly scheduled colonoscopies. Talk to your health care provider about when you need a colonoscopy. °· Exercise every day or as told by your health care provider. °Contact a health care provider if: °· You have new or worsening bleeding during a bowel movement. °· You have new or increased blood in your stool. °· You have a change in bowel habits. °· You unexpectedly lose weight. °This information is not intended to replace advice given to you by your health care provider. Make sure you discuss any questions you have with your health care provider. °Document Released: 11/23/2003 Document Revised: 08/04/2015 Document Reviewed: 01/17/2015 °Elsevier Interactive Patient Education © 2018 [employer] ° ° °

## 2017-01-10 NOTE — H&P (Signed)
Calvin Burns is an 56 y.o. male.   Chief Complaint: Patient is here for colonoscopy. HPI: Patient is a 56 year old Caucasian male who is here for screening colonoscopy.  He denies abdominal pain change in bowel habits or rectal bleeding. History is positive for CRC and second degree relative (grandmother) sooner 56s at the time of diagnosis and died at age 18.  Past Medical History:  Diagnosis Date  . Asthma   . Dysphagia   . Headache(784.0)    History of migraines    Past Surgical History:  Procedure Laterality Date  . COLONOSCOPY     X 2  . ESOPHAGOGASTRODUODENOSCOPY     1 yr ago for dysphagia  . ESOPHAGOGASTRODUODENOSCOPY (EGD) WITH ESOPHAGEAL DILATION  01/10/2012   Procedure: ESOPHAGOGASTRODUODENOSCOPY (EGD) WITH ESOPHAGEAL DILATION;  Surgeon: Rogene Houston, MD;  Location: [facility];  Service: Endoscopy;  Laterality: N/A;  930  . Hemorlroids     14 yr ago.  Marland Kitchen HEMORRHOID SURGERY      Family History  Problem Relation Age of Onset  . Colon cancer Other    Social History:  reports that he has never smoked. He has never used smokeless tobacco. He reports that he drinks about 16.8 oz of alcohol per week . He reports that he does not use drugs.  Allergies: No Known Allergies  Medications Prior to Admission  Medication Sig Dispense Refill  . albuterol (PROVENTIL HFA;VENTOLIN HFA) 108 (90 BASE) MCG/ACT inhaler Inhale 1-2 puffs into the lungs every 6 (six) hours as needed for shortness of breath. Shortness of Breath    . fexofenadine (ALLEGRA) 180 MG tablet Take 180 mg by mouth daily as needed for allergies or rhinitis.    . Fluticasone-Salmeterol (ADVAIR) 250-50 MCG/DOSE AEPB Inhale 1 puff into the lungs every evening.     . meloxicam (MOBIC) 15 MG tablet Take 15 mg by mouth daily.    . naproxen sodium (ANAPROX) 220 MG tablet Take 220-440 mg by mouth daily as needed (pain).    . pantoprazole (PROTONIX) 40 MG tablet TAKE ONE TABLET BY MOUTH ONCE DAILY BEFORE BREAKFAST 90  tablet 4  . Tetrahydrozoline HCl (VISINE OP) Apply 1 drop to eye daily as needed (dry eyes).      No results found for this or any previous visit (from the past 48 hour(s)). No results found.  ROS  Blood pressure 127/89, pulse 74, temperature 97.7 F (36.5 C), temperature source Oral, resp. rate (!) 9, height 6\' 2"  (1.88 m), weight 170 lb (77.1 kg), SpO2 99 %. Physical Exam  Constitutional: He appears well-developed and well-nourished.  HENT:  Mouth/Throat: Oropharynx is clear and moist.  Eyes: Conjunctivae are normal. No scleral icterus.  Neck: No thyromegaly present.  Cardiovascular: Normal rate, regular rhythm and normal heart sounds.   No murmur heard. Respiratory: Effort normal and breath sounds normal.  GI: Soft. He exhibits no distension and no mass. There is no tenderness.  Musculoskeletal: He exhibits no edema.  Lymphadenopathy:    He has no cervical adenopathy.  Neurological: He is alert.  Skin: Skin is warm and dry.     Assessment/Plan Average risk screening colonoscopy.  Hildred Laser, MD 01/10/2017, 10:17 AM

## 2017-01-14 ENCOUNTER — Encounter (HOSPITAL_COMMUNITY): Payer: Self-pay | Admitting: Internal Medicine

## 2017-04-04 DIAGNOSIS — J4530 Mild persistent asthma, uncomplicated: Secondary | ICD-10-CM | POA: Diagnosis not present

## 2017-04-04 DIAGNOSIS — J40 Bronchitis, not specified as acute or chronic: Secondary | ICD-10-CM | POA: Diagnosis not present

## 2017-04-04 DIAGNOSIS — Z7689 Persons encountering health services in other specified circumstances: Secondary | ICD-10-CM | POA: Diagnosis not present

## 2017-04-12 DIAGNOSIS — R7302 Impaired glucose tolerance (oral): Secondary | ICD-10-CM | POA: Insufficient documentation

## 2017-04-18 DIAGNOSIS — Z0000 Encounter for general adult medical examination without abnormal findings: Secondary | ICD-10-CM | POA: Diagnosis not present

## 2017-04-18 DIAGNOSIS — Z1159 Encounter for screening for other viral diseases: Secondary | ICD-10-CM | POA: Diagnosis not present

## 2017-04-18 DIAGNOSIS — Z23 Encounter for immunization: Secondary | ICD-10-CM | POA: Diagnosis not present

## 2017-04-18 DIAGNOSIS — J4530 Mild persistent asthma, uncomplicated: Secondary | ICD-10-CM | POA: Diagnosis not present

## 2017-08-14 DIAGNOSIS — M1712 Unilateral primary osteoarthritis, left knee: Secondary | ICD-10-CM | POA: Diagnosis not present

## 2017-08-14 DIAGNOSIS — M1711 Unilateral primary osteoarthritis, right knee: Secondary | ICD-10-CM | POA: Diagnosis not present

## 2017-08-14 DIAGNOSIS — M25561 Pain in right knee: Secondary | ICD-10-CM | POA: Diagnosis not present

## 2017-10-08 DIAGNOSIS — M1712 Unilateral primary osteoarthritis, left knee: Secondary | ICD-10-CM | POA: Diagnosis not present

## 2017-11-03 ENCOUNTER — Emergency Department: Payer: 59

## 2017-11-03 ENCOUNTER — Other Ambulatory Visit: Payer: Self-pay

## 2017-11-03 ENCOUNTER — Emergency Department
Admission: EM | Admit: 2017-11-03 | Discharge: 2017-11-03 | Disposition: A | Discharge status: Home / Self Care | Payer: 59 | Attending: Student in an Organized Health Care Education/Training Program | Admitting: Student in an Organized Health Care Education/Training Program

## 2017-11-03 DIAGNOSIS — Y9389 Activity, other specified: Secondary | ICD-10-CM | POA: Diagnosis not present

## 2017-11-03 DIAGNOSIS — W25XXXA Contact with sharp glass, initial encounter: Secondary | ICD-10-CM | POA: Diagnosis not present

## 2017-11-03 DIAGNOSIS — S61512A Laceration without foreign body of left wrist, initial encounter: Secondary | ICD-10-CM | POA: Insufficient documentation

## 2017-11-03 DIAGNOSIS — Y998 Other external cause status: Secondary | ICD-10-CM | POA: Insufficient documentation

## 2017-11-03 DIAGNOSIS — Z23 Encounter for immunization: Secondary | ICD-10-CM | POA: Insufficient documentation

## 2017-11-03 DIAGNOSIS — J45909 Unspecified asthma, uncomplicated: Secondary | ICD-10-CM | POA: Insufficient documentation

## 2017-11-03 DIAGNOSIS — Z79899 Other long term (current) drug therapy: Secondary | ICD-10-CM | POA: Insufficient documentation

## 2017-11-03 DIAGNOSIS — Y929 Unspecified place or not applicable: Secondary | ICD-10-CM | POA: Diagnosis not present

## 2017-11-03 MED ORDER — SULFAMETHOXAZOLE-TRIMETHOPRIM 800-160 MG PO TABS: 1.0000 | ORAL_TABLET | Freq: Two times a day (BID) | ORAL | 0 refills | Status: AC

## 2017-11-03 MED ORDER — TETANUS-DIPHTH-ACELL PERTUSSIS 5-2.5-18.5 LF-MCG/0.5 IM SUSP
0.5000 mL | Freq: Once | INTRAMUSCULAR | Status: AC
  Administered 2017-11-03: 0.5 mL via INTRAMUSCULAR
  Filled 2017-11-03: qty 0.5

## 2017-11-03 MED ORDER — ONDANSETRON 4 MG PO TBDP
4.0000 mg | ORAL_TABLET | Freq: Once | ORAL | Status: AC
  Administered 2017-11-03: 4 mg via ORAL
  Filled 2017-11-03: qty 1

## 2017-11-03 MED ORDER — LIDOCAINE-EPINEPHRINE 1 %-1:100000 IJ SOLN
10.0000 mL | Freq: Once | INTRAMUSCULAR | Status: AC
  Administered 2017-11-03: 10 mL
  Filled 2017-11-03: qty 1

## 2017-11-03 MED ORDER — OXYCODONE-ACETAMINOPHEN 5-325 MG PO TABS
1.0000 | ORAL_TABLET | Freq: Once | ORAL | Status: AC
  Administered 2017-11-03: 1 via ORAL
  Filled 2017-11-03: qty 1

## 2017-11-03 MED ORDER — HYDROCODONE-ACETAMINOPHEN 5-325 MG PO TABS: 1.0000 | ORAL_TABLET | Freq: Four times a day (QID) | ORAL | 0 refills | Status: AC | PRN

## 2017-11-03 MED ORDER — LIDOCAINE-EPINEPHRINE 1 %-1:100000 IJ SOLN
INTRAMUSCULAR | Status: AC
  Administered 2017-11-03: 10 mL
  Filled 2017-11-03: qty 1

## 2017-11-03 NOTE — Discharge Instructions (Signed)
Keep repaired laceration clean and dry for 24 hours.  After sutures are removed, apply vitamin E ointment twice daily.  After sutures are removed, apply sunscreen.

## 2017-11-03 NOTE — ED Notes (Signed)
Both providers in room attempting laceration repair.

## 2017-11-03 NOTE — ED Triage Notes (Signed)
Pt states he slipped and fell down about 6 steps while carrying some dishes, states the broken plate cut his left palm/wrist. Bleeding is controlled . Denies any other pain, denies LOC.Marland Kitchen

## 2017-11-03 NOTE — ED Provider Notes (Signed)
[facility] Emergency Department Provider Note  ____________________________________________  Time seen: Approximately 10:28 PM  I have reviewed the triage vital signs and the nursing notes.   HISTORY  Chief Complaint Laceration    HPI Calvin Burns is a 57 y.o. male presents to the emergency department with a 4-1/2 cm left wrist laceration.  Patient reports that he was carrying dishes downstairs when a glass slipped and busted.  Patient denies weakness.  He has had some tingling along laceration site.  Patient did hit his head during fall but did not lose consciousness.  He has had no blurry vision, nausea, vomiting, disorientation or confusion.  No alleviating measures of been attempted.  Tetanus status is out of date.   Past Medical History:  Diagnosis Date  . Asthma   . Dysphagia   . Headache(784.0)    History of migraines    Patient Active Problem List   Diagnosis Date Noted  . Family hx of colon cancer 09/06/2016  . Dysphagia 05/23/2011  . Asthma 05/23/2011    Past Surgical History:  Procedure Laterality Date  . COLONOSCOPY     X 2  . COLONOSCOPY N/A 01/10/2017   Procedure: COLONOSCOPY;  Surgeon: Rogene Houston, MD;  [facility];  Service: Endoscopy;  Laterality: N/A;  9:55  . ESOPHAGOGASTRODUODENOSCOPY     1 yr ago for dysphagia  . ESOPHAGOGASTRODUODENOSCOPY (EGD) WITH ESOPHAGEAL DILATION  01/10/2012   Procedure: ESOPHAGOGASTRODUODENOSCOPY (EGD) WITH ESOPHAGEAL DILATION;  Surgeon: Rogene Houston, MD;  [facility];  Service: Endoscopy;  Laterality: N/A;  930  . Hemorlroids     14 yr ago.  Marland Kitchen HEMORRHOID SURGERY    . POLYPECTOMY  01/10/2017   Procedure: POLYPECTOMY;  Surgeon: Rogene Houston, MD;  [facility];  Service: Endoscopy;;  colon    Prior to Admission medications   Medication Sig Start Date End Date Taking? Authorizing Provider  albuterol (PROVENTIL HFA;VENTOLIN HFA) 108 (90 BASE) MCG/ACT  inhaler Inhale 1-2 puffs into the lungs every 6 (six) hours as needed for shortness of breath. Shortness of Breath    [provider]  fexofenadine (ALLEGRA) 180 MG tablet Take 180 mg by mouth daily as needed for allergies or rhinitis.    [provider]  Fluticasone-Salmeterol (ADVAIR) 250-50 MCG/DOSE AEPB Inhale 1 puff into the lungs every evening.     [provider]  HYDROcodone-acetaminophen (NORCO) 5-325 MG tablet Take 1 tablet by mouth every 6 (six) hours as needed for up to 3 days for moderate pain. 11/03/17 11/06/17  Lannie Fields, PA-C  meloxicam (MOBIC) 15 MG tablet Take 15 mg by mouth daily.    [provider]  naproxen sodium (ANAPROX) 220 MG tablet Take 220-440 mg by mouth daily as needed (pain).    [provider]  pantoprazole (PROTONIX) 40 MG tablet TAKE ONE TABLET BY MOUTH ONCE DAILY BEFORE BREAKFAST 10/26/16   Rehman, Mechele Dawley, MD  sulfamethoxazole-trimethoprim (BACTRIM DS,SEPTRA DS) 800-160 MG tablet Take 1 tablet by mouth 2 (two) times daily for 7 days. 11/03/17 11/10/17  Lannie Fields, PA-C  Tetrahydrozoline HCl (VISINE OP) Apply 1 drop to eye daily as needed (dry eyes).    [provider]    Allergies Patient has no known allergies.  Family History  Problem Relation Age of Onset  . Colon cancer Other     Social History Social History   Tobacco Use  . Smoking status: Never Smoker  . Smokeless  tobacco: Never Used  Substance Use Topics  . Alcohol use: Yes    Alcohol/week: 28.0 standard drinks    Types: 28 Cans of beer per week    Comment: 3 beer a day  . Drug use: No     Review of Systems  Constitutional: No fever/chills Eyes: No visual changes. No discharge ENT: No upper respiratory complaints. Cardiovascular: no chest pain. Respiratory: no cough. No SOB. Gastrointestinal: No abdominal pain.  No nausea, no vomiting.  No diarrhea.  No constipation. Genitourinary: Negative for dysuria. No  hematuria Musculoskeletal: Patient has left wrist pain.  Skin: Patient has left wrist laceration.  Neurological: Negative for headaches, focal weakness or numbness.  ____________________________________________   PHYSICAL EXAM:  VITAL SIGNS: ED Triage Vitals [11/03/17 2050]  Enc Vitals Group     BP (!) 124/92     Pulse Rate 67     Resp 17     Temp 98.4 F (36.9 C)     Temp Source Oral     SpO2 98 %     Weight 165 lb (74.8 kg)     Height 6\' 2"  (1.88 m)     Head Circumference      Peak Flow      Pain Score 6     Pain Loc      Pain Edu?      Excl. in [place]?      Constitutional: Alert and oriented. Well appearing and in no acute distress. Eyes: Conjunctivae are normal. PERRL. EOMI. Head: Atraumatic. ENT:      Ears: TMs are pearly.      Nose: No congestion/rhinnorhea.      Mouth/Throat: Mucous membranes are moist.  Neck: No stridor.  No cervical spine tenderness to palpation. Cardiovascular: Normal rate, regular rhythm. Normal S1 and S2.  Good peripheral circulation. Respiratory: Normal respiratory effort without tachypnea or retractions. Lungs CTAB. Good air entry to the bases with no decreased or absent breath sounds. Gastrointestinal: Bowel sounds 4 quadrants. Soft and nontender to palpation. No guarding or rigidity. No palpable masses. No distention. No CVA tenderness. Musculoskeletal: Full range of motion to all extremities. No gross deformities appreciated. Neurologic:  Normal speech and language. No gross focal neurologic deficits are appreciated.  Skin: Patient has 4-1/2 cm linear angulated laceration along left volar wrist.  Deep to muscle. Psychiatric: Mood and affect are normal. Speech and behavior are normal. Patient exhibits appropriate insight and judgement.   ____________________________________________   LABS (all labs ordered are listed, but only abnormal results are displayed)  Labs Reviewed - No data to  display ____________________________________________  EKG   ____________________________________________  RADIOLOGY I personally viewed and evaluated these images as part of my medical decision making, as well as reviewing the written report by the radiologist.  Dg Wrist Complete Left  Result Date: 11/03/2017 CLINICAL DATA:  Fall, pain and laceration to LEFT wrist. EXAM: LEFT WRIST - COMPLETE 3+ VIEW COMPARISON:  None. FINDINGS: Osseous alignment is normal. No fracture line or displaced fracture fragment seen. No radiodense foreign body appreciated within the adjacent soft tissues. Overlying bandages in place. IMPRESSION: No osseous fracture or dislocation. No foreign body seen. Overlying bandages in place. Electronically Signed   By: Franki Cabot M.D.   On: 11/03/2017 21:39    ____________________________________________    PROCEDURES  Procedure(s) performed:    Procedures  LACERATION REPAIR Performed by: Lannie Fields and assisted by Betha Loa PA-C Authorized by: Lannie Fields Consent: Verbal consent obtained. Risks and  benefits: risks, benefits and alternatives were discussed Consent given by: patient Patient identity confirmed: provided demographic data Prepped and Draped in normal sterile fashion Wound explored  Laceration Location: Left wrist   Laceration Length: 4.5 cm  No Foreign Bodies seen or palpated  Anesthesia: local infiltration  Local anesthetic: lidocaine 1% with epinephrine  Anesthetic total: 12 ml  Irrigation method: syringe Amount of cleaning: 500 cc normal saline  Skin closure:  Superficial: 4-0 Ethilon  Deep: 4-0 Monocryl   Number of sutures:  Superficial: 14 Deep: 5  Technique:  Superficial: Simple Interrupted  Deep: Subcuticular Interrupted  Electrical cautery   Patient tolerance: Patient tolerated the procedure well with no immediate complications.   Medications  lidocaine-EPINEPHrine (XYLOCAINE W/EPI) 1  %-1:100000 (with pres) injection 10 mL (10 mLs Infiltration Given by Other 11/03/17 2112)  oxyCODONE-acetaminophen (PERCOCET/ROXICET) 5-325 MG per tablet 1 tablet (1 tablet Oral Given 11/03/17 2113)  ondansetron (ZOFRAN-ODT) disintegrating tablet 4 mg (4 mg Oral Given 11/03/17 2113)  Tdap (BOOSTRIX) injection 0.5 mL (0.5 mLs Intramuscular Given 11/03/17 2311)     ____________________________________________   INITIAL IMPRESSION / ASSESSMENT AND PLAN / ED COURSE  Pertinent labs & imaging results that were available during my care of the patient were reviewed by me and considered in my medical decision making (see chart for details).  Review of the [place] CSRS was performed in accordance of the [place] prior to dispensing any controlled drugs.      Assessment and plan Left wrist laceration Patient presents to the emergency department with a 4-1/2 cm left wrist laceration.  No acute fractures or retained foreign bodies were identified on x-ray examination of the left wrist.  Patient had a lacerated vein and hemostasis was difficult to achieve in the emergency department.  A tourniquet and limited cautery was employed to achieve hemostasis after lidocaine with epi and packed surgicel failed.  Patient was placed in a wrist immobilizer at discharge.  Patient was referred to Dr. Peggye Ley and the importance of appropriate follow-up was emphasized during 3 instances of this emergency department encounter.  Patient was treated empirically with Bactrim and discharged with a short course of Norco for pain.  Patient's tetanus status was updated prior to being discharged from the emergency department.  Patient was advised to have sutures removed by primary care in 7 days.  All patient questions were answered.  ____________________________________________  FINAL CLINICAL IMPRESSION(S) / ED DIAGNOSES  Final diagnoses:  Laceration of left wrist, initial encounter      NEW MEDICATIONS STARTED DURING THIS  VISIT:  ED Discharge Orders         Ordered    sulfamethoxazole-trimethoprim (BACTRIM DS,SEPTRA DS) 800-160 MG tablet  2 times daily     11/03/17 2303    HYDROcodone-acetaminophen (NORCO) 5-325 MG tablet  Every 6 hours PRN     11/03/17 2308              This chart was dictated using voice recognition software/Dragon. Despite best efforts to proofread, errors can occur which can change the meaning. Any change was purely unintentional.    Karren Cobble 11/03/17 Janus Molder    Merlyn Lot, MD 11/03/17 [phone]

## 2017-11-13 DIAGNOSIS — S61512D Laceration without foreign body of left wrist, subsequent encounter: Secondary | ICD-10-CM | POA: Diagnosis not present

## 2017-11-13 DIAGNOSIS — Z4802 Encounter for removal of sutures: Secondary | ICD-10-CM | POA: Diagnosis not present

## 2017-11-19 ENCOUNTER — Other Ambulatory Visit (INDEPENDENT_AMBULATORY_CARE_PROVIDER_SITE_OTHER): Payer: Self-pay | Admitting: Internal Medicine

## 2017-11-19 DIAGNOSIS — K2100 Gastro-esophageal reflux disease with esophagitis, without bleeding: Secondary | ICD-10-CM

## 2018-02-13 DIAGNOSIS — M1711 Unilateral primary osteoarthritis, right knee: Secondary | ICD-10-CM | POA: Diagnosis not present

## 2018-02-13 DIAGNOSIS — M1712 Unilateral primary osteoarthritis, left knee: Secondary | ICD-10-CM | POA: Diagnosis not present

## 2018-03-19 DIAGNOSIS — B029 Zoster without complications: Secondary | ICD-10-CM | POA: Diagnosis not present

## 2018-03-19 DIAGNOSIS — Z7184 Encounter for health counseling related to travel: Secondary | ICD-10-CM | POA: Diagnosis not present

## 2018-03-19 DIAGNOSIS — R0982 Postnasal drip: Secondary | ICD-10-CM | POA: Diagnosis not present

## 2018-03-27 DIAGNOSIS — M170 Bilateral primary osteoarthritis of knee: Secondary | ICD-10-CM | POA: Diagnosis not present

## 2018-04-08 DIAGNOSIS — M170 Bilateral primary osteoarthritis of knee: Secondary | ICD-10-CM | POA: Diagnosis not present

## 2018-04-11 DIAGNOSIS — Z01812 Encounter for preprocedural laboratory examination: Secondary | ICD-10-CM | POA: Diagnosis not present

## 2018-04-14 DIAGNOSIS — M170 Bilateral primary osteoarthritis of knee: Secondary | ICD-10-CM | POA: Diagnosis not present

## 2018-04-21 DIAGNOSIS — J4530 Mild persistent asthma, uncomplicated: Secondary | ICD-10-CM | POA: Diagnosis not present

## 2018-04-21 DIAGNOSIS — Z79899 Other long term (current) drug therapy: Secondary | ICD-10-CM | POA: Diagnosis not present

## 2018-04-21 DIAGNOSIS — Z0000 Encounter for general adult medical examination without abnormal findings: Secondary | ICD-10-CM | POA: Diagnosis not present

## 2018-04-21 DIAGNOSIS — K219 Gastro-esophageal reflux disease without esophagitis: Secondary | ICD-10-CM | POA: Diagnosis not present

## 2018-04-21 DIAGNOSIS — R7302 Impaired glucose tolerance (oral): Secondary | ICD-10-CM | POA: Diagnosis not present

## 2018-05-22 DIAGNOSIS — M170 Bilateral primary osteoarthritis of knee: Secondary | ICD-10-CM | POA: Diagnosis not present

## 2018-05-27 DIAGNOSIS — L03115 Cellulitis of right lower limb: Secondary | ICD-10-CM | POA: Diagnosis not present

## 2018-07-03 DIAGNOSIS — M25561 Pain in right knee: Secondary | ICD-10-CM | POA: Diagnosis not present

## 2018-07-03 DIAGNOSIS — G8929 Other chronic pain: Secondary | ICD-10-CM | POA: Diagnosis not present

## 2018-07-03 DIAGNOSIS — M170 Bilateral primary osteoarthritis of knee: Secondary | ICD-10-CM | POA: Diagnosis not present

## 2018-07-07 ENCOUNTER — Other Ambulatory Visit: Payer: Self-pay | Admitting: Orthopedic Surgery

## 2018-07-07 DIAGNOSIS — M25561 Pain in right knee: Principal | ICD-10-CM

## 2018-07-07 DIAGNOSIS — M170 Bilateral primary osteoarthritis of knee: Secondary | ICD-10-CM

## 2018-07-07 DIAGNOSIS — G8929 Other chronic pain: Secondary | ICD-10-CM

## 2018-07-23 ENCOUNTER — Ambulatory Visit: Payer: 59

## 2018-07-30 ENCOUNTER — Ambulatory Visit: Payer: 59

## 2018-08-06 ENCOUNTER — Ambulatory Visit
Admission: RE | Admit: 2018-08-06 | Discharge: 2018-08-06 | Disposition: A | Discharge status: Home / Self Care | Payer: 59 | Source: Ambulatory Visit | Attending: Orthopedic Surgery | Admitting: Orthopedic Surgery

## 2018-08-06 ENCOUNTER — Other Ambulatory Visit: Payer: Self-pay

## 2018-08-06 DIAGNOSIS — G8929 Other chronic pain: Secondary | ICD-10-CM | POA: Insufficient documentation

## 2018-08-06 DIAGNOSIS — M25561 Pain in right knee: Secondary | ICD-10-CM | POA: Insufficient documentation

## 2018-08-06 DIAGNOSIS — M170 Bilateral primary osteoarthritis of knee: Secondary | ICD-10-CM | POA: Diagnosis present

## 2018-10-05 ENCOUNTER — Other Ambulatory Visit (INDEPENDENT_AMBULATORY_CARE_PROVIDER_SITE_OTHER): Payer: Self-pay | Admitting: Internal Medicine

## 2018-10-05 DIAGNOSIS — K2100 Gastro-esophageal reflux disease with esophagitis, without bleeding: Secondary | ICD-10-CM

## 2018-11-05 NOTE — Discharge Instructions (Signed)
°  Instructions after Total Knee Replacement ° ° Dorcas Melito P. Lenville Hibberd, Jr., M.D.    ° Dept. of Orthopaedics & Sports Medicine ° [facility] ° [address] ° [place], [place]  [address] ° Phone: [phone]   Fax: [phone] ° °  °DIET: °• Drink plenty of non-alcoholic fluids. °• Resume your normal diet. Include foods high in fiber. ° °ACTIVITY:  °• You may use crutches or a walker with weight-bearing as tolerated, unless instructed otherwise. °• You may be weaned off of the walker or crutches by your Physical Therapist.  °• Do NOT place pillows under the knee. Anything placed under the knee could limit your ability to straighten the knee.   °• Continue doing gentle exercises. Exercising will reduce the pain and swelling, increase motion, and prevent muscle weakness.   °• Please continue to use the TED compression stockings for 6 weeks. You may remove the stockings at night, but should reapply them in the morning. °• Do not drive or operate any equipment until instructed. ° °WOUND CARE:  °• Continue to use the PolarCare or ice packs periodically to reduce pain and swelling. °• You may bathe or shower after the staples are removed at the first office visit following surgery. ° °MEDICATIONS: °• You may resume your regular medications. °• Please take the pain medication as prescribed on the medication. °• Do not take pain medication on an empty stomach. °• You have been given a prescription for a blood thinner (Lovenox or Coumadin). Please take the medication as instructed. (NOTE: After completing a 2 week course of Lovenox, take one Enteric-coated aspirin once a day. This along with elevation will help reduce the possibility of phlebitis in your operated leg.) °• Do not drive or drink alcoholic beverages when taking pain medications. ° °CALL THE OFFICE FOR: °• Temperature above 101 degrees °• Excessive bleeding or drainage on the dressing. °• Excessive swelling, coldness, or paleness of the toes. °• Persistent  nausea and vomiting. ° °FOLLOW-UP:  °• You should have an appointment to return to the office in 10-14 days after surgery. °• Arrangements have been made for continuation of Physical Therapy (either home therapy or outpatient therapy). °  °

## 2018-11-06 ENCOUNTER — Encounter
Admission: RE | Admit: 2018-11-06 | Discharge: 2018-11-06 | Disposition: A | Discharge status: Home / Self Care | Payer: 59 | Source: Ambulatory Visit | Attending: Orthopedic Surgery | Admitting: Orthopedic Surgery

## 2018-11-06 ENCOUNTER — Other Ambulatory Visit: Payer: Self-pay

## 2018-11-06 DIAGNOSIS — Z20828 Contact with and (suspected) exposure to other viral communicable diseases: Secondary | ICD-10-CM | POA: Diagnosis not present

## 2018-11-06 DIAGNOSIS — Z01818 Encounter for other preprocedural examination: Secondary | ICD-10-CM | POA: Diagnosis present

## 2018-11-06 HISTORY — DX: Gastro-esophageal reflux disease without esophagitis: K21.9

## 2018-11-06 HISTORY — DX: Other complications of anesthesia, initial encounter: T88.59XA

## 2018-11-06 LAB — COMPREHENSIVE METABOLIC PANEL
ALT: 15 U/L (ref 0–44)
AST: 19 U/L (ref 15–41)
Albumin: 3.6 g/dL (ref 3.5–5.0)
Alkaline Phosphatase: 64 U/L (ref 38–126)
Anion gap: 8 (ref 5–15)
BUN: 13 mg/dL (ref 6–20)
CO2: 27 mmol/L (ref 22–32)
Calcium: 8.8 mg/dL — ABNORMAL LOW (ref 8.9–10.3)
Chloride: 101 mmol/L (ref 98–111)
Creatinine, Ser: 0.82 mg/dL (ref 0.61–1.24)
GFR calc Af Amer: >60 mL/min (ref >60)
GFR calc non Af Amer: >60 mL/min (ref >60)
Glucose, Bld: 61 mg/dL — ABNORMAL LOW (ref 70–99)
Potassium: 3.5 mmol/L (ref 3.5–5.1)
Sodium: 136 mmol/L (ref 135–145)
Total Bilirubin: 0.8 mg/dL (ref 0.3–1.2)
Total Protein: 6.9 g/dL (ref 6.5–8.1)

## 2018-11-06 LAB — URINALYSIS, ROUTINE W REFLEX MICROSCOPIC
Bacteria, UA: NONE SEEN
Bilirubin Urine: NEGATIVE
Glucose, UA: NEGATIVE mg/dL
Ketones, ur: NEGATIVE mg/dL
Leukocytes,Ua: NEGATIVE
Nitrite: NEGATIVE
Protein, ur: NEGATIVE mg/dL
Specific Gravity, Urine: 1.009 (ref 1.005–1.030)
Squamous Epithelial / HPF: NONE SEEN (ref 0–5)
WBC, UA: NONE SEEN WBC/hpf (ref 0–5)
pH: 6 (ref 5.0–8.0)

## 2018-11-06 LAB — SURGICAL PCR SCREEN
MRSA, PCR: NEGATIVE
Staphylococcus aureus: POSITIVE — AB

## 2018-11-06 LAB — CBC
HCT: 45.9 % (ref 39.0–52.0)
Hemoglobin: 14.8 g/dL (ref 13.0–17.0)
MCH: 23.6 pg — ABNORMAL LOW (ref 26.0–34.0)
MCHC: 32.2 g/dL (ref 30.0–36.0)
MCV: 73.2 fL — ABNORMAL LOW (ref 80.0–100.0)
Platelets: 330 10*3/uL (ref 150–400)
RBC: 6.27 MIL/uL — ABNORMAL HIGH (ref 4.22–5.81)
RDW: 15.6 % — ABNORMAL HIGH (ref 11.5–15.5)
WBC: 8.1 10*3/uL (ref 4.0–10.5)
nRBC: 0 % (ref 0.0–0.2)

## 2018-11-06 LAB — TYPE AND SCREEN
ABO/RH(D): O POS
Antibody Screen: NEGATIVE

## 2018-11-06 LAB — PROTIME-INR
INR: 1 (ref 0.8–1.2)
Prothrombin Time: 12.6 seconds (ref 11.4–15.2)

## 2018-11-06 LAB — SEDIMENTATION RATE: Sed Rate: 2 mm/hr (ref 0–20)

## 2018-11-06 LAB — APTT: aPTT: 27 seconds (ref 24–36)

## 2018-11-06 LAB — C-REACTIVE PROTEIN: CRP: <0.8 mg/dL (ref <1.0)

## 2018-11-06 NOTE — OR Nursing (Addendum)
EKG abnormal. Reviewed by Dr. Ola Spurr. I called his PCP to request the results from the one done on 04/21/18 as results are not available in Epic or Care Everywhere. I was told they would return a call to PAT regarding that.   I called Tiffany in Dr. Clydell Hakim office. Sent medical clearance request. Dr. Clydell Hakim office will request and follow this clearance.

## 2018-11-06 NOTE — Patient Instructions (Signed)
Your procedure is scheduled on: Wednesday 11/12/18   Report to [place]. To find out your arrival time please call [phone] between 1PM - 3PM on Tuesday 11/11/18   Remember: Instructions that are not followed completely may result in serious medical risk, up to and including death, or upon the discretion of your surgeon and anesthesiologist your surgery may need to be rescheduled.      _X__ 1. Do not eat food after midnight the night before your procedure.                 No gum chewing or hard candies. You may drink clear liquids up to 2 hours                 before you are scheduled to arrive for your surgery- DO NOT drink clear                 liquids within 2 hours of the start of your surgery.                 Clear Liquids include:  water, apple juice without pulp, clear carbohydrate                 drink such as Clearfast or Gatorade, Black Coffee or Tea (Do not add                 milk or creamer to coffee or tea).   ** Dr. Marry Guan would like for you to finish your Ensure Pre-Surgery drink 3 hours before your arrival time.   __X__2.  On the morning of surgery brush your teeth with toothpaste and water, you may rinse your mouth with mouthwash if you wish.  Do not swallow any toothpaste or mouthwash.      _X__ 3.  No Alcohol for 24 hours before or after surgery.    _X__ 4.  Do Not Smoke or use e-cigarettes For 24 Hours Prior to Your Surgery.                 Do not use any chewable tobacco products for at least 6 hours prior to                 surgery.    __X__6.  Notify your doctor if there is any change in your medical condition      (cold, fever, infections).       Do not wear jewelry, make-up, hairpins, clips or nail polish. Do not wear lotions, powders, or perfumes.  Do not shave 48 hours prior to surgery. Men may shave face and neck. Do not bring valuables to the hospital.    [facility] is not responsible  for any belongings or valuables.   Contacts, dentures/partials or body piercings may not be worn into surgery. Bring a case for your contacts, glasses or hearing aids, a denture cup will be supplied.   For patients admitted to the hospital, discharge time is determined by your treatment team.    Patients discharged the day of surgery will not be allowed to drive home.    Please read over the following fact sheets that you were given:   MRSA Information   __X__ Take these medicines the morning of surgery with A SIP OF WATER:     1. albuterol (PROVENTIL HFA;VENTOLIN HFA) 108 (90 BASE) MCG/ACT inhaler  2. Fluticasone-Salmeterol (ADVAIR) 250-50 MCG/DOSE AEPB  3. pantoprazole (PROTONIX) 40 MG  tablet: TAKE ONE THE NIGHT BEFORE AND ONE THE MORNING OF YOUR SURGERY.  4. acetaminophen (TYLENOL) 650 MG CR tablet  5. fexofenadine (ALLEGRA) 180 MG tablet (IF NEEDED).  6.    __X__ Use CHG Soap as directed   _ X___ Use inhalers on the day of surgery.     __X__ Stop Anti-inflammatories 7 days before surgery such as Advil, Ibuprofen, Motrin, BC or Goodies Powder, Naprosyn, Naproxen, Aleve, Aspirin, Meloxicam, Etodolac. May take Tylenol if needed for pain or discomfort.    __X__ Do not begin taking any new herbal supplements before your procedure.

## 2018-11-07 ENCOUNTER — Other Ambulatory Visit
Admission: RE | Admit: 2018-11-07 | Discharge: 2018-11-07 | Disposition: A | Discharge status: Home / Self Care | Payer: 59 | Source: Ambulatory Visit | Attending: Orthopedic Surgery | Admitting: Orthopedic Surgery

## 2018-11-07 DIAGNOSIS — Z01818 Encounter for other preprocedural examination: Secondary | ICD-10-CM | POA: Diagnosis not present

## 2018-11-07 LAB — URINE CULTURE
Culture: NO GROWTH
Special Requests: NORMAL

## 2018-11-07 LAB — SARS CORONAVIRUS 2 (TAT 6-24 HRS): SARS Coronavirus 2: NEGATIVE

## 2018-11-07 LAB — HEMOGLOBIN A1C
Hgb A1c MFr Bld: 5.3 % (ref 4.8–5.6)
Mean Plasma Glucose: 105.41 mg/dL

## 2018-11-07 NOTE — OR Nursing (Signed)
Faxed abnormal lab results to Dr. Hooten. Fax confirmation received.  

## 2018-11-11 ENCOUNTER — Encounter: Payer: Self-pay | Admitting: Orthopedic Surgery

## 2018-11-11 DIAGNOSIS — J4520 Mild intermittent asthma, uncomplicated: Secondary | ICD-10-CM | POA: Insufficient documentation

## 2018-11-11 DIAGNOSIS — K219 Gastro-esophageal reflux disease without esophagitis: Secondary | ICD-10-CM | POA: Insufficient documentation

## 2018-11-11 MED ORDER — CEFAZOLIN SODIUM-DEXTROSE 2-4 GM/100ML-% IV SOLN: 2.0000 g | INTRAVENOUS | Status: DC

## 2018-11-12 ENCOUNTER — Inpatient Hospital Stay: Payer: 59 | Admitting: Anesthesiology

## 2018-11-12 ENCOUNTER — Inpatient Hospital Stay: Payer: 59

## 2018-11-12 ENCOUNTER — Other Ambulatory Visit: Payer: Self-pay

## 2018-11-12 ENCOUNTER — Inpatient Hospital Stay
Admission: RE | Admit: 2018-11-12 | Discharge: 2018-11-14 | DRG: 470 | Disposition: A | Discharge status: Home Health | Payer: 59 | Source: Ambulatory Visit | Attending: Orthopedic Surgery | Admitting: Orthopedic Surgery

## 2018-11-12 ENCOUNTER — Encounter
Admission: RE | Disposition: A | Discharge status: Home Health | Payer: Self-pay | Source: Ambulatory Visit | Attending: Orthopedic Surgery

## 2018-11-12 DIAGNOSIS — M1712 Unilateral primary osteoarthritis, left knee: Secondary | ICD-10-CM | POA: Diagnosis present

## 2018-11-12 DIAGNOSIS — K219 Gastro-esophageal reflux disease without esophagitis: Secondary | ICD-10-CM | POA: Diagnosis present

## 2018-11-12 DIAGNOSIS — Z825 Family history of asthma and other chronic lower respiratory diseases: Secondary | ICD-10-CM

## 2018-11-12 DIAGNOSIS — G43909 Migraine, unspecified, not intractable, without status migrainosus: Secondary | ICD-10-CM | POA: Diagnosis present

## 2018-11-12 DIAGNOSIS — Z79899 Other long term (current) drug therapy: Secondary | ICD-10-CM

## 2018-11-12 DIAGNOSIS — J45909 Unspecified asthma, uncomplicated: Secondary | ICD-10-CM | POA: Diagnosis present

## 2018-11-12 DIAGNOSIS — Z96652 Presence of left artificial knee joint: Secondary | ICD-10-CM

## 2018-11-12 DIAGNOSIS — Z96659 Presence of unspecified artificial knee joint: Secondary | ICD-10-CM

## 2018-11-12 DIAGNOSIS — Z91048 Other nonmedicinal substance allergy status: Secondary | ICD-10-CM | POA: Diagnosis not present

## 2018-11-12 DIAGNOSIS — M25562 Pain in left knee: Secondary | ICD-10-CM | POA: Diagnosis present

## 2018-11-12 HISTORY — PX: KNEE ARTHROPLASTY: SHX992

## 2018-11-12 LAB — URINE DRUG SCREEN, QUALITATIVE (ARMC ONLY)
Amphetamines, Ur Screen: NOT DETECTED
Barbiturates, Ur Screen: NOT DETECTED
Benzodiazepine, Ur Scrn: NOT DETECTED
Cannabinoid 50 Ng, Ur ~~LOC~~: POSITIVE — AB
Cocaine Metabolite,Ur ~~LOC~~: NOT DETECTED
MDMA (Ecstasy)Ur Screen: NOT DETECTED
Methadone Scn, Ur: NOT DETECTED
Opiate, Ur Screen: NOT DETECTED
Phencyclidine (PCP) Ur S: NOT DETECTED
Tricyclic, Ur Screen: NOT DETECTED

## 2018-11-12 LAB — ABO/RH: ABO/RH(D): O POS

## 2018-11-12 SURGERY — ARTHROPLASTY, KNEE, TOTAL, USING IMAGELESS COMPUTER-ASSISTED NAVIGATION
Anesthesia: Spinal | Site: Knee | Laterality: Left

## 2018-11-12 MED ORDER — FLEET ENEMA 7-19 GM/118ML RE ENEM: 1.0000 | ENEMA | Freq: Once | RECTAL | Status: DC | PRN

## 2018-11-12 MED ORDER — METOCLOPRAMIDE HCL 10 MG PO TABS: 5.0000 mg | ORAL_TABLET | Freq: Three times a day (TID) | ORAL | Status: DC | PRN

## 2018-11-12 MED ORDER — ONDANSETRON HCL 4 MG/2ML IJ SOLN: 4.0000 mg | Freq: Four times a day (QID) | INTRAMUSCULAR | Status: DC | PRN

## 2018-11-12 MED ORDER — TRAMADOL HCL 50 MG PO TABS
50.0000 mg | ORAL_TABLET | ORAL | Status: DC | PRN
  Administered 2018-11-12: 50 mg via ORAL
  Filled 2018-11-12: qty 1

## 2018-11-12 MED ORDER — NEOMYCIN-POLYMYXIN B GU 40-200000 IR SOLN
Status: DC | PRN
  Administered 2018-11-12: 14 mL

## 2018-11-12 MED ORDER — ONDANSETRON HCL 4 MG/2ML IJ SOLN: 4.0000 mg | Freq: Once | INTRAMUSCULAR | Status: DC | PRN

## 2018-11-12 MED ORDER — SODIUM CHLORIDE 0.9 % IV SOLN
INTRAVENOUS | Status: DC | PRN
  Administered 2018-11-12: 14:00:00 40 ug/min via INTRAVENOUS

## 2018-11-12 MED ORDER — BUPIVACAINE HCL (PF) 0.25 % IJ SOLN
INTRAMUSCULAR | Status: DC | PRN
  Administered 2018-11-12: 60 mL

## 2018-11-12 MED ORDER — MIDAZOLAM HCL 2 MG/2ML IJ SOLN
INTRAMUSCULAR | Status: AC
  Filled 2018-11-12: qty 2

## 2018-11-12 MED ORDER — ONDANSETRON HCL 4 MG PO TABS: 4.0000 mg | ORAL_TABLET | Freq: Four times a day (QID) | ORAL | Status: DC | PRN

## 2018-11-12 MED ORDER — METOCLOPRAMIDE HCL 10 MG PO TABS
10.0000 mg | ORAL_TABLET | Freq: Three times a day (TID) | ORAL | Status: DC
  Administered 2018-11-12 – 2018-11-14 (×6): 10 mg via ORAL
  Filled 2018-11-12 (×6): qty 1

## 2018-11-12 MED ORDER — SENNOSIDES-DOCUSATE SODIUM 8.6-50 MG PO TABS
1.0000 | ORAL_TABLET | Freq: Two times a day (BID) | ORAL | Status: DC
  Administered 2018-11-12 – 2018-11-13 (×3): 1 via ORAL
  Filled 2018-11-12 (×4): qty 1

## 2018-11-12 MED ORDER — OXYCODONE HCL 5 MG PO TABS
5.0000 mg | ORAL_TABLET | ORAL | Status: DC | PRN
  Administered 2018-11-13 – 2018-11-14 (×4): 5 mg via ORAL
  Filled 2018-11-12 (×2): qty 1

## 2018-11-12 MED ORDER — TRANEXAMIC ACID-NACL 1000-0.7 MG/100ML-% IV SOLN
1000.0000 mg | Freq: Once | INTRAVENOUS | Status: AC
  Administered 2018-11-12: 1000 mg via INTRAVENOUS
  Filled 2018-11-12: qty 100

## 2018-11-12 MED ORDER — MOMETASONE FURO-FORMOTEROL FUM 200-5 MCG/ACT IN AERO
2.0000 | INHALATION_SPRAY | Freq: Two times a day (BID) | RESPIRATORY_TRACT | Status: DC
  Administered 2018-11-12 – 2018-11-14 (×4): 2 via RESPIRATORY_TRACT
  Filled 2018-11-12: qty 8.8

## 2018-11-12 MED ORDER — MENTHOL 3 MG MT LOZG
1.0000 | LOZENGE | OROMUCOSAL | Status: DC | PRN
  Filled 2018-11-12: qty 9

## 2018-11-12 MED ORDER — PHENOL 1.4 % MT LIQD
1.0000 | OROMUCOSAL | Status: DC | PRN
  Filled 2018-11-12: qty 177

## 2018-11-12 MED ORDER — ACETAMINOPHEN 325 MG PO TABS: 325.0000 mg | ORAL_TABLET | Freq: Four times a day (QID) | ORAL | Status: DC | PRN

## 2018-11-12 MED ORDER — GABAPENTIN 300 MG PO CAPS
ORAL_CAPSULE | ORAL | Status: AC
  Administered 2018-11-12: 300 mg via ORAL
  Filled 2018-11-12: qty 1

## 2018-11-12 MED ORDER — HYDROMORPHONE HCL 1 MG/ML IJ SOLN: 0.5000 mg | INTRAMUSCULAR | Status: DC | PRN

## 2018-11-12 MED ORDER — ACETAMINOPHEN 10 MG/ML IV SOLN
INTRAVENOUS | Status: AC
  Administered 2018-11-12: 19:00:00 1000 mg via INTRAVENOUS
  Filled 2018-11-12: qty 100

## 2018-11-12 MED ORDER — TETRAHYDROZOLINE HCL 0.05 % OP SOLN
1.0000 [drp] | Freq: Every day | OPHTHALMIC | Status: DC | PRN
  Filled 2018-11-12: qty 15

## 2018-11-12 MED ORDER — DEXAMETHASONE SODIUM PHOSPHATE 10 MG/ML IJ SOLN
8.0000 mg | Freq: Once | INTRAMUSCULAR | Status: AC
  Administered 2018-11-12: 11:00:00 8 mg via INTRAVENOUS

## 2018-11-12 MED ORDER — PROPOFOL 10 MG/ML IV BOLUS
INTRAVENOUS | Status: AC
  Filled 2018-11-12: qty 20

## 2018-11-12 MED ORDER — FENTANYL CITRATE (PF) 100 MCG/2ML IJ SOLN
INTRAMUSCULAR | Status: AC
  Filled 2018-11-12: qty 2

## 2018-11-12 MED ORDER — ACETAMINOPHEN 10 MG/ML IV SOLN
1000.0000 mg | Freq: Four times a day (QID) | INTRAVENOUS | Status: AC
  Administered 2018-11-12 – 2018-11-13 (×4): 1000 mg via INTRAVENOUS
  Filled 2018-11-12 (×3): qty 100

## 2018-11-12 MED ORDER — DIPHENHYDRAMINE HCL 12.5 MG/5ML PO ELIX: 12.5000 mg | ORAL_SOLUTION | ORAL | Status: DC | PRN

## 2018-11-12 MED ORDER — CHLORHEXIDINE GLUCONATE 4 % EX LIQD: 60.0000 mL | Freq: Once | CUTANEOUS | Status: DC

## 2018-11-12 MED ORDER — PROPOFOL 500 MG/50ML IV EMUL
INTRAVENOUS | Status: AC
  Filled 2018-11-12: qty 50

## 2018-11-12 MED ORDER — METOCLOPRAMIDE HCL 5 MG/ML IJ SOLN: 5.0000 mg | Freq: Three times a day (TID) | INTRAMUSCULAR | Status: DC | PRN

## 2018-11-12 MED ORDER — ONDANSETRON HCL 4 MG/2ML IJ SOLN
INTRAMUSCULAR | Status: DC | PRN
  Administered 2018-11-12: 4 mg via INTRAVENOUS

## 2018-11-12 MED ORDER — CEFAZOLIN SODIUM-DEXTROSE 2-4 GM/100ML-% IV SOLN
INTRAVENOUS | Status: AC
  Filled 2018-11-12: qty 100

## 2018-11-12 MED ORDER — MIDAZOLAM HCL 5 MG/5ML IJ SOLN
INTRAMUSCULAR | Status: DC | PRN
  Administered 2018-11-12 (×2): 1 mg via INTRAVENOUS

## 2018-11-12 MED ORDER — SODIUM CHLORIDE 0.9 % IV SOLN
INTRAVENOUS | Status: DC | PRN
  Administered 2018-11-12: 16:00:00 60 mL

## 2018-11-12 MED ORDER — GABAPENTIN 300 MG PO CAPS
300.0000 mg | ORAL_CAPSULE | Freq: Every day | ORAL | Status: DC
  Administered 2018-11-12 – 2018-11-13 (×2): 300 mg via ORAL
  Filled 2018-11-12 (×2): qty 1

## 2018-11-12 MED ORDER — PHENYLEPHRINE HCL (PRESSORS) 10 MG/ML IV SOLN
INTRAVENOUS | Status: DC | PRN
  Administered 2018-11-12 (×3): 100 ug via INTRAVENOUS

## 2018-11-12 MED ORDER — ENSURE PRE-SURGERY PO LIQD
296.0000 mL | Freq: Once | ORAL | Status: DC
  Filled 2018-11-12: qty 296

## 2018-11-12 MED ORDER — ALUM & MAG HYDROXIDE-SIMETH 200-200-20 MG/5ML PO SUSP: 30.0000 mL | ORAL | Status: DC | PRN

## 2018-11-12 MED ORDER — PANTOPRAZOLE SODIUM 40 MG PO TBEC
40.0000 mg | DELAYED_RELEASE_TABLET | Freq: Two times a day (BID) | ORAL | Status: DC
  Administered 2018-11-12 – 2018-11-14 (×4): 40 mg via ORAL
  Filled 2018-11-12 (×4): qty 1

## 2018-11-12 MED ORDER — FENTANYL CITRATE (PF) 100 MCG/2ML IJ SOLN: 25.0000 ug | INTRAMUSCULAR | Status: DC | PRN

## 2018-11-12 MED ORDER — CELECOXIB 200 MG PO CAPS
200.0000 mg | ORAL_CAPSULE | Freq: Two times a day (BID) | ORAL | Status: DC
  Administered 2018-11-12 – 2018-11-14 (×4): 200 mg via ORAL
  Filled 2018-11-12 (×4): qty 1

## 2018-11-12 MED ORDER — CELECOXIB 200 MG PO CAPS
400.0000 mg | ORAL_CAPSULE | Freq: Once | ORAL | Status: AC
  Administered 2018-11-12: 11:00:00 400 mg via ORAL

## 2018-11-12 MED ORDER — MAGNESIUM HYDROXIDE 400 MG/5ML PO SUSP
30.0000 mL | Freq: Every day | ORAL | Status: DC
  Administered 2018-11-13: 30 mL via ORAL
  Filled 2018-11-12 (×3): qty 30

## 2018-11-12 MED ORDER — CEFAZOLIN SODIUM-DEXTROSE 2-3 GM-%(50ML) IV SOLR
INTRAVENOUS | Status: DC | PRN
  Administered 2018-11-12: 2 g via INTRAVENOUS

## 2018-11-12 MED ORDER — PROPOFOL 10 MG/ML IV BOLUS
INTRAVENOUS | Status: DC | PRN
  Administered 2018-11-12 (×2): 40 mg via INTRAVENOUS

## 2018-11-12 MED ORDER — PROPOFOL 500 MG/50ML IV EMUL
INTRAVENOUS | Status: DC | PRN
  Administered 2018-11-12: 100 ug/kg/min via INTRAVENOUS

## 2018-11-12 MED ORDER — GABAPENTIN 300 MG PO CAPS
300.0000 mg | ORAL_CAPSULE | Freq: Once | ORAL | Status: AC
  Administered 2018-11-12: 11:00:00 300 mg via ORAL

## 2018-11-12 MED ORDER — BUPIVACAINE HCL (PF) 0.25 % IJ SOLN
INTRAMUSCULAR | Status: AC
  Filled 2018-11-12: qty 60

## 2018-11-12 MED ORDER — LORATADINE 10 MG PO TABS
10.0000 mg | ORAL_TABLET | Freq: Every day | ORAL | Status: DC
  Administered 2018-11-13 – 2018-11-14 (×2): 10 mg via ORAL
  Filled 2018-11-12 (×2): qty 1

## 2018-11-12 MED ORDER — BISACODYL 10 MG RE SUPP: 10.0000 mg | Freq: Every day | RECTAL | Status: DC | PRN

## 2018-11-12 MED ORDER — TRANEXAMIC ACID-NACL 1000-0.7 MG/100ML-% IV SOLN
1000.0000 mg | INTRAVENOUS | Status: AC
  Administered 2018-11-12: 1000 mg via INTRAVENOUS
  Filled 2018-11-12: qty 100

## 2018-11-12 MED ORDER — FERROUS SULFATE 325 (65 FE) MG PO TABS
325.0000 mg | ORAL_TABLET | Freq: Two times a day (BID) | ORAL | Status: DC
  Administered 2018-11-13 – 2018-11-14 (×3): 325 mg via ORAL
  Filled 2018-11-12 (×3): qty 1

## 2018-11-12 MED ORDER — ENOXAPARIN SODIUM 30 MG/0.3ML ~~LOC~~ SOLN
30.0000 mg | Freq: Two times a day (BID) | SUBCUTANEOUS | Status: DC
  Administered 2018-11-13 – 2018-11-14 (×3): 30 mg via SUBCUTANEOUS
  Filled 2018-11-12 (×3): qty 0.3

## 2018-11-12 MED ORDER — LACTATED RINGERS IV SOLN
INTRAVENOUS | Status: DC
  Administered 2018-11-12: 11:00:00 via INTRAVENOUS

## 2018-11-12 MED ORDER — SODIUM CHLORIDE 0.9 % IV SOLN
INTRAVENOUS | Status: DC
  Administered 2018-11-12 (×2): via INTRAVENOUS

## 2018-11-12 MED ORDER — FENTANYL CITRATE (PF) 100 MCG/2ML IJ SOLN
INTRAMUSCULAR | Status: DC | PRN
  Administered 2018-11-12 (×2): 50 ug via INTRAVENOUS

## 2018-11-12 MED ORDER — OXYCODONE HCL 5 MG PO TABS
10.0000 mg | ORAL_TABLET | ORAL | Status: DC | PRN
  Administered 2018-11-13 (×2): 10 mg via ORAL
  Filled 2018-11-12 (×4): qty 2

## 2018-11-12 MED ORDER — BUPIVACAINE LIPOSOME 1.3 % IJ SUSP
INTRAMUSCULAR | Status: AC
  Filled 2018-11-12: qty 20

## 2018-11-12 MED ORDER — BUPIVACAINE HCL (PF) 0.5 % IJ SOLN
INTRAMUSCULAR | Status: DC | PRN
  Administered 2018-11-12: 3 mL

## 2018-11-12 MED ORDER — ALBUTEROL SULFATE (2.5 MG/3ML) 0.083% IN NEBU: 2.5000 mg | INHALATION_SOLUTION | Freq: Four times a day (QID) | RESPIRATORY_TRACT | Status: DC | PRN

## 2018-11-12 MED ORDER — CEFAZOLIN SODIUM-DEXTROSE 2-4 GM/100ML-% IV SOLN
2.0000 g | Freq: Four times a day (QID) | INTRAVENOUS | Status: AC
  Administered 2018-11-12 – 2018-11-13 (×4): 2 g via INTRAVENOUS
  Filled 2018-11-12 (×4): qty 100

## 2018-11-12 MED ORDER — CELECOXIB 200 MG PO CAPS
ORAL_CAPSULE | ORAL | Status: AC
  Administered 2018-11-12: 11:00:00 400 mg via ORAL
  Filled 2018-11-12: qty 2

## 2018-11-12 MED ORDER — DEXAMETHASONE SODIUM PHOSPHATE 10 MG/ML IJ SOLN
INTRAMUSCULAR | Status: AC
  Administered 2018-11-12: 8 mg via INTRAVENOUS
  Filled 2018-11-12: qty 1

## 2018-11-12 SURGICAL SUPPLY — 72 items
ATTUNE MED DOME PAT 41 KNEE (Knees) ×2 IMPLANT
ATTUNE MED DOME PAT 41MM KNEE (Knees) ×1 IMPLANT
ATTUNE PS FEM LT SZ 7 CEM KNEE (Femur) ×3 IMPLANT
ATTUNE PSRP INSR SZ7 5 KNEE (Insert) ×2 IMPLANT
ATTUNE PSRP INSR SZ7 5MM KNEE (Insert) ×1 IMPLANT
BASE TIBIAL ROT PLAT SZ 8 KNEE (Knees) ×1 IMPLANT
BATTERY INSTRU NAVIGATION (MISCELLANEOUS) ×12 IMPLANT
BLADE SAW 70X12.5 (BLADE) IMPLANT
BLADE SAW 90X13X1.19 OSCILLAT (BLADE) IMPLANT
BLADE SAW 90X25X1.19 OSCILLAT (BLADE) IMPLANT
BNDG STRETCH 4X75 STRL LF (GAUZE/BANDAGES/DRESSINGS) ×3 IMPLANT
CANISTER SUCT 3000ML PPV (MISCELLANEOUS) ×3 IMPLANT
CEMENT HV SMART SET (Cement) ×6 IMPLANT
COOLER POLAR GLACIER W/PUMP (MISCELLANEOUS) ×3 IMPLANT
COVER WAND RF STERILE (DRAPES) ×3 IMPLANT
CUFF TOURN SGL QUICK 24 (TOURNIQUET CUFF) ×2
CUFF TRNQT CYL 24X4X16.5-23 (TOURNIQUET CUFF) ×1 IMPLANT
DRAPE 3/4 80X56 (DRAPES) ×3 IMPLANT
DRSG DERMACEA 8X12 NADH (GAUZE/BANDAGES/DRESSINGS) ×3 IMPLANT
DRSG OPSITE POSTOP 4X12 (GAUZE/BANDAGES/DRESSINGS) ×3 IMPLANT
DRSG OPSITE POSTOP 4X14 (GAUZE/BANDAGES/DRESSINGS) ×3 IMPLANT
DRSG TEGADERM 4X4.75 (GAUZE/BANDAGES/DRESSINGS) ×3 IMPLANT
DURAPREP 26ML APPLICATOR (WOUND CARE) ×6 IMPLANT
ELECT REM PT RETURN 9FT ADLT (ELECTROSURGICAL) ×3
ELECTRODE REM PT RTRN 9FT ADLT (ELECTROSURGICAL) ×1 IMPLANT
EX-PIN ORTHOLOCK NAV 4X150 (PIN) ×6 IMPLANT
GLOVE BIOGEL M STRL SZ7.5 (GLOVE) ×6 IMPLANT
GLOVE INDICATOR 8.0 STRL GRN (GLOVE) ×3 IMPLANT
GOWN STRL REUS W/ TWL LRG LVL3 (GOWN DISPOSABLE) ×2 IMPLANT
GOWN STRL REUS W/TWL LRG LVL3 (GOWN DISPOSABLE) ×4
HEMOVAC 400CC 10FR (MISCELLANEOUS) ×3 IMPLANT
HOLDER FOLEY CATH W/STRAP (MISCELLANEOUS) ×3 IMPLANT
HOOD PEEL AWAY FLYTE STAYCOOL (MISCELLANEOUS) ×6 IMPLANT
KIT TURNOVER KIT A (KITS) ×3 IMPLANT
KNIFE SCULPS 14X20 (INSTRUMENTS) ×3 IMPLANT
LABEL OR SOLS (LABEL) ×3 IMPLANT
MANIFOLD NEPTUNE II (INSTRUMENTS) ×3 IMPLANT
NDL SAFETY ECLIPSE 18X1.5 (NEEDLE) ×1 IMPLANT
NEEDLE HYPO 18GX1.5 SHARP (NEEDLE) ×2
NEEDLE SPNL 20GX3.5 QUINCKE YW (NEEDLE) ×6 IMPLANT
NS IRRIG 500ML POUR BTL (IV SOLUTION) ×3 IMPLANT
PACK TOTAL KNEE (MISCELLANEOUS) ×3 IMPLANT
PAD ABD DERMACEA PRESS 5X9 (GAUZE/BANDAGES/DRESSINGS) ×3 IMPLANT
PAD WRAPON POLAR KNEE (MISCELLANEOUS) ×1 IMPLANT
PENCIL SMOKE ULTRAEVAC 22 CON (MISCELLANEOUS) ×3 IMPLANT
PIN DRILL QUICK PACK ×3 IMPLANT
PIN FIXATION 1/8DIA X 3INL (PIN) ×9 IMPLANT
PULSAVAC PLUS IRRIG FAN TIP (DISPOSABLE) ×3
SOL .9 NS 3000ML IRR  AL (IV SOLUTION) ×2
SOL .9 NS 3000ML IRR UROMATIC (IV SOLUTION) ×1 IMPLANT
SOL PREP PVP 2OZ (MISCELLANEOUS) ×3
SOLUTION PREP PVP 2OZ (MISCELLANEOUS) ×1 IMPLANT
SPONGE DRAIN TRACH 4X4 STRL 2S (GAUZE/BANDAGES/DRESSINGS) ×3 IMPLANT
SPONGE LAP 18X18 RF (DISPOSABLE) ×3 IMPLANT
STAPLER SKIN PROX 35W (STAPLE) ×3 IMPLANT
STOCKINETTE BIAS CUT 6 980064 (GAUZE/BANDAGES/DRESSINGS) ×3 IMPLANT
STOCKINETTE IMPERV 14X48 (MISCELLANEOUS) IMPLANT
STRAP TIBIA SHORT (MISCELLANEOUS) ×3 IMPLANT
SUCTION FRAZIER HANDLE 10FR (MISCELLANEOUS) ×2
SUCTION FRAZIER TIP 10 FR DISP (SUCTIONS) ×3 IMPLANT
SUCTION TUBE FRAZIER 10FR DISP (MISCELLANEOUS) ×1 IMPLANT
SUT VIC AB 0 CT1 36 (SUTURE) ×6 IMPLANT
SUT VIC AB 1 CT1 36 (SUTURE) ×6 IMPLANT
SUT VIC AB 2-0 CT2 27 (SUTURE) ×3 IMPLANT
SYR 20ML LL LF (SYRINGE) ×3 IMPLANT
SYR 30ML LL (SYRINGE) ×6 IMPLANT
TIBIAL BASE ROT PLAT SZ 8 KNEE (Knees) ×3 IMPLANT
TIP FAN IRRIG PULSAVAC PLUS (DISPOSABLE) ×1 IMPLANT
TOWEL OR 17X26 4PK STRL BLUE (TOWEL DISPOSABLE) ×3 IMPLANT
TOWER CARTRIDGE SMART MIX (DISPOSABLE) ×3 IMPLANT
TRAY FOLEY MTR SLVR 16FR STAT (SET/KITS/TRAYS/PACK) ×3 IMPLANT
WRAPON POLAR PAD KNEE (MISCELLANEOUS) ×3

## 2018-11-12 NOTE — H&P (Signed)
The patient has been re-examined, and the chart reviewed, and there have been no interval changes to the documented history and physical.    The risks, benefits, and alternatives have been discussed at length. The patient expressed understanding of the risks benefits and agreed with plans for surgical intervention.  James P. Hooten, Jr. M.D.    

## 2018-11-12 NOTE — Anesthesia Preprocedure Evaluation (Signed)
Anesthesia Evaluation  Patient identified by MRN, date of birth, ID band Patient awake    Reviewed: Allergy & Precautions, H&P , NPO status , Patient's Chart, lab work & pertinent test results, reviewed documented beta blocker date and time   History of Anesthesia Complications (+) history of anesthetic complications  Airway Mallampati: II   Neck ROM: full    Dental  (+) Poor Dentition   Pulmonary asthma , Patient abstained from smoking.,    Pulmonary exam normal        Cardiovascular Exercise Tolerance: Good negative cardio ROS Normal cardiovascular exam Rhythm:regular Rate:Normal     Neuro/Psych  Headaches, negative neurological ROS  negative psych ROS   GI/Hepatic Neg liver ROS, GERD  ,  Endo/Other  negative endocrine ROS  Renal/GU negative Renal ROS  negative genitourinary   Musculoskeletal   Abdominal   Peds  Hematology negative hematology ROS (+)   Anesthesia Other Findings Past Medical History: No date: Asthma No date: Complication of anesthesia     Comment:  Spinal headache that resulted in a blood patch. No date: Dysphagia No date: GERD (gastroesophageal reflux disease) No date: Headache(784.0)     Comment:  History of migraines Past Surgical History: No date: COLONOSCOPY     Comment:  X 2 01/10/2017: COLONOSCOPY; N/A     Comment:  Procedure: COLONOSCOPY;  Surgeon: Rogene Houston, MD;               [facility];  Service: Endoscopy;                Laterality: N/A;  9:55 No date: ESOPHAGOGASTRODUODENOSCOPY     Comment:  1 yr ago for dysphagia 01/10/2012: ESOPHAGOGASTRODUODENOSCOPY (EGD) WITH ESOPHAGEAL DILATION     Comment:  Procedure: ESOPHAGOGASTRODUODENOSCOPY (EGD) WITH               ESOPHAGEAL DILATION;  Surgeon: Rogene Houston, MD;                [facility];  Service: Endoscopy;                Laterality: N/A;  930 No date: Hemorlroids     Comment:  14 yr  ago. No date: HEMORRHOID SURGERY 01/10/2017: POLYPECTOMY     Comment:  Procedure: POLYPECTOMY;  Surgeon: Rogene Houston, MD;               [facility];  Service: Endoscopy;;  colon No date: TONSILLECTOMY   Reproductive/Obstetrics negative OB ROS                             Anesthesia Physical Anesthesia Plan  ASA: II  Anesthesia Plan: Spinal   Post-op Pain Management:    Induction:   PONV Risk Score and Plan:   Airway Management Planned:   Additional Equipment:   Intra-op Plan:   Post-operative Plan:   Informed Consent: I have reviewed the patients History and Physical, chart, labs and discussed the procedure including the risks, benefits and alternatives for the proposed anesthesia with the patient or authorized representative who has indicated his/her understanding and acceptance.     Dental Advisory Given  Plan Discussed with: CRNA  Anesthesia Plan Comments:         Anesthesia Quick Evaluation

## 2018-11-12 NOTE — Anesthesia Procedure Notes (Signed)
Spinal  Patient location during procedure: OR Start time: 11/12/2018 12:35 PM Staffing Anesthesiologist: Molli Barrows, MD Performed: anesthesiologist  Preanesthetic Checklist Completed: patient identified, site marked, surgical consent, pre-op evaluation, timeout performed, IV checked, risks and benefits discussed and monitors and equipment checked Spinal Block Patient position: sitting Prep: DuraPrep Patient monitoring: heart rate, cardiac monitor, continuous pulse ox and blood pressure Approach: midline Location: L3-4 Injection technique: single-shot Needle Needle type: Sprotte  Needle gauge: 24 G Needle length: 9 cm Assessment Sensory level: T4

## 2018-11-12 NOTE — Op Note (Signed)
OPERATIVE NOTE  DATE OF SURGERY:  11/12/2018  PATIENT NAME:  Calvin Burns   DOB: 1960-05-31  MRN: [id number]  PRE-OPERATIVE DIAGNOSIS: Degenerative arthrosis of the left knee, primary  POST-OPERATIVE DIAGNOSIS:  Same  PROCEDURE:  Left total knee arthroplasty using computer-assisted navigation  SURGEON:  Marciano Sequin. M.D.  ASSISTANT:  Benjaman Lobe, RN (present and scrubbed throughout the case, critical for assistance with exposure, retraction, instrumentation, and closure)  ANESTHESIA: spinal  ESTIMATED BLOOD LOSS: 50 mL  FLUIDS REPLACED: 1200 mL of crystalloid  TOURNIQUET TIME: 103 minutes  DRAINS: 2 medium Hemovac drains  SOFT TISSUE RELEASES: Anterior cruciate ligament, posterior cruciate ligament, deep medial collateral ligament, patellofemoral ligament  IMPLANTS UTILIZED: DePuy Attune size 7 posterior stabilized femoral component (cemented), size 8 rotating platform tibial component (cemented), 41 mm medialized dome patella (cemented), and a 5 mm stabilized rotating platform polyethylene insert.  INDICATIONS FOR SURGERY: Calvin Burns is a 58 y.o. year old male with a long history of progressive knee pain. X-rays demonstrated severe degenerative changes in tricompartmental fashion. The patient had not seen any significant improvement despite conservative nonsurgical intervention. After discussion of the risks and benefits of surgical intervention, the patient expressed understanding of the risks benefits and agree with plans for total knee arthroplasty.   The risks, benefits, and alternatives were discussed at length including but not limited to the risks of infection, bleeding, nerve injury, stiffness, blood clots, the need for revision surgery, cardiopulmonary complications, among others, and they were willing to proceed.  PROCEDURE IN DETAIL: The patient was brought into the operating room and, after adequate spinal anesthesia was achieved, a tourniquet was placed  on the patient's upper thigh. The patient's knee and leg were cleaned and prepped with alcohol and DuraPrep and draped in the usual sterile fashion. A "timeout" was performed as per usual protocol. The lower extremity was exsanguinated using an Esmarch, and the tourniquet was inflated to 300 mmHg. An anterior longitudinal incision was made followed by a standard mid vastus approach. The deep fibers of the medial collateral ligament were elevated in a subperiosteal fashion off of the medial flare of the tibia so as to maintain a continuous soft tissue sleeve. The patella was subluxed laterally and the patellofemoral ligament was incised. Inspection of the knee demonstrated severe degenerative changes with full-thickness loss of articular cartilage. Osteophytes were debrided using a rongeur. Anterior and posterior cruciate ligaments were excised. Two 4.0 mm Schanz pins were inserted in the femur and into the tibia for attachment of the array of trackers used for computer-assisted navigation. Hip center was identified using a circumduction technique. Distal landmarks were mapped using the computer. The distal femur and proximal tibia were mapped using the computer. The distal femoral cutting guide was positioned using computer-assisted navigation so as to achieve a 5 distal valgus cut. The femur was sized and it was felt that a size 7 femoral component was appropriate. A size 7 femoral cutting guide was positioned and the anterior cut was performed and verified using the computer. This was followed by completion of the posterior and chamfer cuts. Femoral cutting guide for the central box was then positioned in the center box cut was performed.  Attention was then directed to the proximal tibia. Medial and lateral menisci were excised. The extramedullary tibial cutting guide was positioned using computer-assisted navigation so as to achieve a 0 varus-valgus alignment and 3 posterior slope. The cut was performed and  verified using the computer. The proximal tibia  was sized and it was felt that a size 8 tibial tray was appropriate. Tibial and femoral trials were inserted followed by insertion of a 5 mm polyethylene insert. This allowed for excellent mediolateral soft tissue balancing both in flexion and in full extension. Finally, the patella was cut and prepared so as to accommodate a 41 mm medialized dome patella. A patella trial was placed and the knee was placed through a range of motion with excellent patellar tracking appreciated. The femoral trial was removed after debridement of posterior osteophytes. The central post-hole for the tibial component was reamed followed by insertion of a keel punch. Tibial trials were then removed. Cut surfaces of bone were irrigated with copious amounts of normal saline with antibiotic solution using pulsatile lavage and then suctioned dry. Polymethylmethacrylate cement  was prepared in the usual fashion using a vacuum mixer. Cement was applied to the cut surface of the proximal tibia as well as along the undersurface of a size 8 rotating platform tibial component. Tibial component was positioned and impacted into place. Excess cement was removed using [employer] fast streamer. Cement was then applied to the cut surfaces of the femur as well as along the posterior flanges of the size 7 femoral component. The femoral component was positioned and impacted into place. Excess cement was removed using [employer] fast streamer. A 5 mm polyethylene trial was inserted and the knee was brought into full extension with steady axial compression applied. Finally, cement was applied to the backside of a 41 mm medialized dome patella and the patellar component was positioned and patellar clamp applied. Excess cement was removed using [employer] fast streamer. After adequate curing of the cement, the tourniquet was deflated after a total tourniquet time of 103 minutes. Hemostasis was achieved using electrocautery. The knee was  irrigated with copious amounts of normal saline with antibiotic solution using pulsatile lavage and then suctioned dry. 20 mL of 1.3% Exparel and 60 mL of 0.25% Marcaine in 40 mL of normal saline was injected along the posterior capsule, medial and lateral gutters, and along the arthrotomy site. A 5 mm stabilized rotating platform polyethylene insert was inserted and the knee was placed through a range of motion with excellent mediolateral soft tissue balancing appreciated and excellent patellar tracking noted. 2 medium drains were placed in the wound bed and brought out through separate stab incisions. The medial parapatellar portion of the incision was reapproximated using interrupted sutures of #1 Vicryl. Subcutaneous tissue was approximated in layers using first #0 Vicryl followed #2-0 Vicryl. The skin was approximated with skin staples. A sterile dressing was applied.  The patient tolerated the procedure well and was transported to the recovery room in stable condition.    Gaylyn Berish P. Holley Bouche., M.D.

## 2018-11-12 NOTE — Transfer of Care (Signed)
Immediate Anesthesia Transfer of Care Note  Patient: Calvin Burns  Procedure(s) Performed: COMPUTER ASSISTED TOTAL KNEE ARTHROPLASTY (Left Knee)  Patient Location: PACU  Anesthesia Type:Spinal  Level of Consciousness: awake, alert  and oriented  Airway & Oxygen Therapy: Patient Spontanous Breathing  Post-op Assessment: Report given to RN and Post -op Vital signs reviewed and stable  Post vital signs: Reviewed and stable  Last Vitals:  Vitals Value Taken Time  BP    Temp    Pulse 89 11/12/18 1640  Resp 13 11/12/18 1640  SpO2 98 % 11/12/18 1640  Vitals shown include unvalidated device data.  Last Pain:  Vitals:   11/12/18 1056  TempSrc: Temporal  PainSc: 2          Complications: No apparent anesthesia complications

## 2018-11-12 NOTE — Anesthesia Post-op Follow-up Note (Signed)
Anesthesia QCDR form completed.        

## 2018-11-13 ENCOUNTER — Encounter: Payer: Self-pay | Admitting: Orthopedic Surgery

## 2018-11-13 MED ORDER — OXYCODONE HCL 5 MG PO TABS: 5.0000 mg | ORAL_TABLET | ORAL | 0 refills | Status: DC | PRN

## 2018-11-13 MED ORDER — TRAMADOL HCL 50 MG PO TABS: 50.0000 mg | ORAL_TABLET | ORAL | 1 refills | Status: DC | PRN

## 2018-11-13 MED ORDER — ENOXAPARIN SODIUM 40 MG/0.4ML ~~LOC~~ SOLN: 40.0000 mg | SUBCUTANEOUS | 0 refills | Status: DC

## 2018-11-13 MED ORDER — CELECOXIB 200 MG PO CAPS: 200.0000 mg | ORAL_CAPSULE | Freq: Two times a day (BID) | ORAL | 1 refills | Status: DC

## 2018-11-13 NOTE — Evaluation (Signed)
Physical Therapy Evaluation Patient Details Name: Calvin Burns MRN: [id number] DOB: 29-Jun-1960 Today's Date: 11/13/2018   History of Present Illness  Pt is 58 year old male s/p L TKA. Other history includes GERD and migraines  Clinical Impression  Pt is a pleasant 58 year old male who was admitted for L TKA. Pt performs bed mobility, transfers, and ambulation with cga and RW. Pt demonstrates ability to perform 10 SLRs with independence, therefore does not require KI for mobility. Pt demonstrates deficits with strength/mobility/pain. Pt is motivated to perform therapy. Would benefit from skilled PT to address above deficits and promote optimal return to PLOF. Recommend transition to [place] upon discharge from acute hospitalization.     Follow Up Recommendations Home health PT    Equipment Recommendations  Rolling walker with 5" wheels;3in1 (PT)    Recommendations for Other Services       Precautions / Restrictions Precautions Precautions: Fall;Knee Precaution Booklet Issued: No Restrictions Weight Bearing Restrictions: Yes LLE Weight Bearing: Weight bearing as tolerated      Mobility  Bed Mobility Overal bed mobility: Needs Assistance Bed Mobility: Supine to Sit     Supine to sit: Min guard     General bed mobility comments: safe technique with ease of technique. Once seated at EOB, upright posture noted.  Transfers Overall transfer level: Needs assistance Equipment used: Rolling walker (2 wheeled) Transfers: Sit to/from Stand Sit to Stand: Min guard         General transfer comment: Upright posture noted. Safe technique performed.  Ambulation/Gait Ambulation/Gait assistance: Min guard Gait Distance (Feet): 80 Feet Assistive device: Rolling walker (2 wheeled) Gait Pattern/deviations: Step-to pattern     General Gait Details: ambulated using step to gait pattern and improving to reciprocal gait. Needs cues keeping RW close to body  Stairs             Wheelchair Mobility    Modified Rankin (Stroke Patients Only)       Balance Overall balance assessment: Needs assistance Sitting-balance support: Feet supported Sitting balance-Leahy Scale: Good     Standing balance support: Bilateral upper extremity supported Standing balance-Leahy Scale: Good                               Pertinent Vitals/Pain Pain Assessment: 0-10 Pain Score: 2  Pain Location: L knee Pain Descriptors / Indicators: Aching;Discomfort Pain Intervention(s): Limited activity within patient's tolerance;Ice applied;Repositioned    Home Living Family/patient expects to be discharged to:: Private residence Living Arrangements: Spouse/significant other;Children Available Help at Discharge: Family Type of Home: House Home Access: Stairs to enter Entrance Stairs-Rails: Can reach both Entrance Stairs-Number of Steps: 4 Home Layout: One level Home Equipment: [place] held shower head      Prior Function Level of Independence: Independent         Comments: Pt was independent in all ADLs working full time and plans to have R knee replaced in December. His son (age 90) lives at home  with him and his wife.     Hand Dominance   Dominant Hand: Right    Extremity/Trunk Assessment   Upper Extremity Assessment Upper Extremity Assessment: Overall WFL for tasks assessed    Lower Extremity Assessment Lower Extremity Assessment: Generalized weakness(L LE grossly 3/5; R LE grossly 5/5)    Cervical / Trunk Assessment Cervical / Trunk Assessment: Normal  Communication   Communication: No difficulties  Cognition Arousal/Alertness: Awake/alert Behavior During Therapy:  WFL for tasks assessed/performed Overall Cognitive Status: Within Functional Limits for tasks assessed                                        General Comments      Exercises Total Joint Exercises Goniometric ROM: L knee AAROM: 0-74 degrees Other  Exercises Other Exercises: supine therex performed on L LE including AP, quad sets, SLRs, hip abd/add, and knee flexion stretches. All ther-ex performed x 10 reps with cga and assist for technique.   Assessment/Plan    PT Assessment Patient needs continued PT services  PT Problem List Decreased strength;Decreased activity tolerance;Decreased balance;Decreased mobility;Decreased range of motion;Decreased knowledge of use of DME;Pain       PT Treatment Interventions DME instruction;Gait training;Stair training;Therapeutic exercise;Balance training    PT Goals (Current goals can be found in the Care Plan section)  Acute Rehab PT Goals Patient Stated Goal: to regain strength and independence so I can have my other knee done in December PT Goal Formulation: With patient Time For Goal Achievement: 11/27/18 Potential to Achieve Goals: Good    Frequency BID   Barriers to discharge        Co-evaluation               AM-PAC PT "6 Clicks" Mobility  Outcome Measure Help needed turning from your back to your side while in a flat bed without using bedrails?: None Help needed moving from lying on your back to sitting on the side of a flat bed without using bedrails?: None Help needed moving to and from a bed to a chair (including a wheelchair)?: A Little Help needed standing up from a chair using your arms (e.g., wheelchair or bedside chair)?: A Little Help needed to walk in hospital room?: A Little Help needed climbing 3-5 steps with a railing? : A Little 6 Click Score: 20    End of Session Equipment Utilized During Treatment: Gait belt Activity Tolerance: Patient tolerated treatment well Patient left: in chair;with chair alarm set;with SCD's reapplied Nurse Communication: Mobility status PT Visit Diagnosis: Muscle weakness (generalized) (M62.81);Difficulty in walking, not elsewhere classified (R26.2);Pain Pain - Right/Left: Left Pain - part of body: Knee    Time: 0936-1006 PT  Time Calculation (min) (ACUTE ONLY): 30 min   Charges:   PT Evaluation $PT Eval Low Complexity: 1 Low PT Treatments $Therapeutic Exercise: 8-22 mins        Greggory Stallion, PT, DPT [phone]   Calvin Burns 11/13/2018, 1:22 PM

## 2018-11-13 NOTE — TOC Initial Note (Signed)
Transition of Care [facility]) - Initial/Assessment Note    Patient Details  Name: Calvin Burns MRN: [id number] Date of Birth: 29-Oct-1960  Transition of Care [facility]) CM/SW Contact:    Su Hilt, RN Phone Number: 11/13/2018, 2:21 PM  Clinical Narrative:                 Patient lives at home with his spouse and adult son He has a shower seat built in and a hand held shower, he would benefit from a RW and BSC, notified Brad with [facility] [place] he would like to use [facility], I notified Corene Cornea with [facility] His spouse provides transportation and he can afford his medications, Gave the price of Lovenox $12  Expected Discharge Plan: [place] Barriers to Discharge: Continued Medical Work up   Patient Goals and CMS Choice Patient states their goals for this hospitalization and ongoing recovery are:: go home CMS Medicare.gov Compare Post Acute Care list provided to:: Patient Choice offered to / list presented to : Patient  Expected Discharge Plan and Services Expected Discharge Plan: [place]   Discharge Planning Services: CM Consult Post Acute Care Choice: [place] arrangements for the past 2 months: Single Family Home                 DME Arranged: 3-N-1, Walker rolling DME Agency: [facility] Date DME Agency Contacted: 11/13/18 Time DME Agency Contacted: [phone] Representative spoke with at DME Agency: [place]: PT [place]: [place] ([place]) Date [place]: 11/13/18 Time [place]: 60 Representative spoke with at [place]: Corene Cornea  Prior Living Arrangements/Services Living arrangements for the past 2 months: Single Family Home Lives with:: Adult Children, Spouse Patient language and need for interpreter reviewed:: Yes Do you feel safe going back to the place where you live?: Yes      Need for Family Participation in Patient Care: No (Comment) Care giver support system in place?: Yes  (comment) Current home services: DME(shower seat and hand held shower) Criminal Activity/Legal Involvement Pertinent to Current Situation/Hospitalization: No - Comment as needed  Activities of Daily Living Home Assistive Devices/Equipment: Eyeglasses ADL Screening (condition at time of admission) Patient's cognitive ability adequate to safely complete daily activities?: Yes Is the patient deaf or have difficulty hearing?: No Does the patient have difficulty seeing, even when wearing glasses/contacts?: No Does the patient have difficulty concentrating, remembering, or making decisions?: No Patient able to express need for assistance with ADLs?: Yes Does the patient have difficulty dressing or bathing?: No Independently performs ADLs?: Yes (appropriate for developmental age) Does the patient have difficulty walking or climbing stairs?: Yes Weakness of Legs: None Weakness of Arms/Hands: None  Permission Sought/Granted   Permission granted to share information with : Yes, Verbal Permission Granted              Emotional Assessment Appearance:: Appears stated age Attitude/Demeanor/Rapport: Engaged Affect (typically observed): Appropriate, Calm Orientation: : Oriented to Self, Oriented to Place, Oriented to  Time, Oriented to Situation Alcohol / Substance Use: Not Applicable Psych Involvement: No (comment)  Admission diagnosis:  PRIMARY OSTEOARTHRITIS OF LEFT KNEE Patient Active Problem List   Diagnosis Date Noted  . Total knee replacement status 11/12/2018  . GERD (gastroesophageal reflux disease) 11/11/2018  . Mild intermittent asthma 11/11/2018  . Primary osteoarthritis of left knee 08/14/2017  . Primary osteoarthritis of right knee 08/14/2017  . IGT (impaired glucose tolerance) 04/12/2017  . Family hx  of colon cancer 09/06/2016  . Dysphagia 05/23/2011  . Asthma 05/23/2011   PCP:  Sallee Lange, NP Pharmacy[facility]   [id number] [place], [place] [address] Phone: [phone] Fax: [phone]     Social Determinants of Health (SDOH) Interventions    Readmission Risk Interventions No flowsheet data found.

## 2018-11-13 NOTE — Progress Notes (Signed)
  Subjective: 1 Day Post-Op Procedure(s) (LRB): COMPUTER ASSISTED TOTAL KNEE ARTHROPLASTY (Left) Patient reports pain as mild.   Patient seen in rounds with Dr. Marry Guan. Patient is well, and has had no acute complaints or problems Plan is to go Home after hospital stay. Negative for chest pain and shortness of breath Fever: no Gastrointestinal: Negative for nausea and vomiting  Objective: Vital signs in last 24 hours: Temp:  [97.4 F (36.3 C)-98.5 F (36.9 C)] 98.5 F (36.9 C) (09/03 0339) Pulse Rate:  [71-98] 71 (09/03 0339) Resp:  [11-23] 18 (09/03 0339) BP: (118-148)/(61-99) 144/92 (09/03 0339) SpO2:  [98 %-100 %] 98 % (09/03 0339)  Intake/Output from previous day:  Intake/Output Summary (Last 24 hours) at 11/13/2018 0653 Last data filed at 11/13/2018 0500 Gross per 24 hour  Intake 1967.81 ml  Output 2445 ml  Net -477.19 ml    Intake/Output this shift: Total I/O In: 1267.8 [I.V.:967.8; IV Piggyback:300] Out: [id number]; Drains:320]  Labs: No results for input(s): HGB in the last 72 hours. No results for input(s): WBC, RBC, HCT, PLT in the last 72 hours. No results for input(s): NA, K, CL, CO2, BUN, CREATININE, GLUCOSE, CALCIUM in the last 72 hours. No results for input(s): LABPT, INR in the last 72 hours.   EXAM General - Patient is Alert and Oriented Extremity - Sensation intact distally Intact pulses distally Dorsiflexion/Plantar flexion intact Compartment soft Dressing/Incision - clean, dry, with the Hemovac intact Motor Function - intact, moving foot and toes well on exam.  Able straight leg raise independently  Past Medical History:  Diagnosis Date  . Asthma   . Complication of anesthesia    Spinal headache that resulted in a blood patch.  [place] Kitchen Dysphagia   . GERD (gastroesophageal reflux disease)   . Headache(784.0)    History of migraines    Assessment/Plan: 1 Day Post-Op Procedure(s) (LRB): COMPUTER ASSISTED TOTAL KNEE ARTHROPLASTY  (Left) Active Problems:   Total knee replacement status  Estimated body mass index is 21.09 kg/m as calculated from the following:   Height as of 11/06/18: 6\' 2"  (1.88 m).   Weight as of 11/06/18: 74.5 kg. Advance diet Up with therapy D/C IV fluids Discharge home with home health planned for tomorrow  DVT Prophylaxis - Lovenox, Foot Pumps and TED hose Weight-Bearing as tolerated to left leg  Reche Dixon, PA-C Orthopaedic Surgery 11/13/2018, 6:53 AM

## 2018-11-13 NOTE — TOC Benefit Eligibility Note (Signed)
Transition of Care [facility]) Benefit Eligibility Note    Patient Details  Name: Calvin Burns MRN: [id number] Date of Birth: 19-Apr-1960   Medication/Dose: Enoxaparin 28m once daily for 14 days  Covered?: Yes  Prescription Coverage [facility]  Spoke with Person/Company/Phone Number:: Bethany with [facility] [employer] at [phone] Co-Pay: $12.00 estimated copay  Prior Approval: No  Deduct[place]: [place]: [phone] [phone]/05/2018, 1:32 PM

## 2018-11-13 NOTE — Anesthesia Postprocedure Evaluation (Signed)
Anesthesia Post Note  Patient: Calvin Burns  Procedure(s) Performed: COMPUTER ASSISTED TOTAL KNEE ARTHROPLASTY (Left Knee)  Patient location during evaluation: Nursing Unit Anesthesia Type: Spinal Level of consciousness: oriented and awake and alert Pain management: pain level controlled Vital Signs Assessment: post-procedure vital signs reviewed and stable Respiratory status: spontaneous breathing and respiratory function stable Cardiovascular status: blood pressure returned to baseline and stable Postop Assessment: no headache, no backache, no apparent nausea or vomiting and patient able to bend at knees Anesthetic complications: no     Last Vitals:  Vitals:   11/12/18 2323 11/13/18 0339  BP: (!) 145/97 (!) 144/92  Pulse: 73 71  Resp: 18 18  Temp: 36.7 C 36.9 C  SpO2: 100% 98%    Last Pain:  Vitals:   11/13/18 0720  TempSrc:   PainSc: 2                  [name]

## 2018-11-13 NOTE — TOC Progression Note (Signed)
Transition of Care [facility]) - Progression Note    Patient Details  Name: KAYL MARTINEZ MRN: [id number] Date of Birth: [birth year]  Transition of Care [facility]) CM/SW [place], RN Phone Number: 11/13/2018, 9:29 AM  Clinical Narrative:     Requested the price of lovenox will notify the patient once obtained      Expected Discharge Plan and Services                                                 Social Determinants of Health (SDOH) Interventions    Readmission Risk Interventions No flowsheet data found.

## 2018-11-13 NOTE — Evaluation (Signed)
Occupational Therapy Evaluation Patient Details Name: Calvin Burns MRN: [id number] DOB: 1960/04/28 Today's Date: 11/13/2018    History of Present Illness Pt is 58 year old male s/p L TKA.   Clinical Impression   Pt is 58  year old male s/p L TKA.  Pt was independent in all ADLs prior to surgery, working full time in [place] and is eager to return to PLOF.  He lives at home with his wife and 71 year old son. Pt currently requires min assist for LB dressing while in seated position due to pain and limited AROM of L knee.  Pt would benefit from instruction in dressing techniques with or without assistive devices for dressing and bathing skills.  Pt would also benefit from recommendations for home modifications to increase safety in the bathroom and prevent falls. Will assess for OT [facility] needs as pt progresses in therapy but most likely will not need any further OT after discharge.      Follow Up Recommendations  No OT follow up    Equipment Recommendations       Recommendations for Other Services       Precautions / Restrictions Precautions Precautions: Fall;Knee Restrictions Weight Bearing Restrictions: Yes LLE Weight Bearing: Weight bearing as tolerated      Mobility Bed Mobility                  Transfers                      Balance                                           ADL either performed or assessed with clinical judgement   ADL Overall ADL's : Needs assistance/impaired Eating/Feeding: Independent;Set up   Grooming: Wash/dry hands;Wash/dry face;Oral care;Set up;Independent;Brushing hair   Upper Body Bathing: Independent;Set up   Lower Body Bathing: Minimal assistance;Set up   Upper Body Dressing : Independent;Set up   Lower Body Dressing: Minimal assistance;Set up;With adaptive equipment                 General ADL Comments: Pt was able to ambulate to NSG station with PT prior to OT session with pain at 1-2/10.  He  is progressing well and most likely will not need any AD after discharge for ADLs. He is using a RW for ambulation but did not use any before surgery.  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     Vision Baseline Vision/History: No visual deficits Patient Visual Report: No change from baseline       Perception     Praxis      Pertinent Vitals/Pain Pain Assessment: 0-10 Pain Score: 1  Pain Location: L knee Pain Descriptors / Indicators: Aching;Discomfort Pain Intervention(s): Limited activity within patient's tolerance;Monitored during session;Premedicated before session;Repositioned     Hand Dominance Right   Extremity/Trunk Assessment Upper Extremity Assessment Upper Extremity Assessment: Overall WFL for tasks assessed   Lower Extremity Assessment Lower Extremity Assessment: Defer to PT evaluation   Cervical / Trunk Assessment Cervical / Trunk Assessment: Normal   Communication Communication Communication: No difficulties   Cognition Arousal/Alertness: Awake/alert Behavior During Therapy: WFL for tasks assessed/performed Overall Cognitive Status: Within Functional Limits for tasks assessed  General Comments       Exercises     Shoulder Instructions      Home Living Family/patient expects to be discharged to:: Private residence Living Arrangements: Spouse/significant other;Children Available Help at Discharge: Family Type of Home: House       Home Layout: One level     Bathroom Shower/Tub: Tub/shower unit;Walk-in shower(plans to use shower stall)   Bathroom Toilet: Standard Bathroom Accessibility: Yes   Home Equipment: Shower seat;Hand held shower head          Prior Functioning/Environment Level of Independence: Independent        Comments: Pt was independent in all ADLs working full time and plans to have R knee replaced in December. His son (age 71) lives at home  with  him and his wife.        OT Problem List: Decreased strength;Pain;Decreased activity tolerance;Decreased range of motion      OT Treatment/Interventions: Self-care/ADL training;Patient/family education;DME and/or AE instruction    OT Goals(Current goals can be found in the care plan section) Acute Rehab OT Goals Patient Stated Goal: to regain strength and independence so I can have my other knee done in December OT Goal Formulation: With patient Time For Goal Achievement: 11/27/18 Potential to Achieve Goals: Good ADL Goals Pt Will Perform Lower Body Dressing: with set-up;with supervision;with adaptive equipment;sit to/from stand Pt Will Transfer to Toilet: with set-up;with supervision;stand pivot transfer;regular height toilet  OT Frequency: Min 1X/week   Barriers to D/C:            Co-evaluation              AM-PAC OT "6 Clicks" Daily Activity     Outcome Measure Help from another person eating meals?: None Help from another person taking care of personal grooming?: None Help from another person toileting, which includes using toliet, bedpan, or urinal?: A Little Help from another person bathing (including washing, rinsing, drying)?: A Little Help from another person to put on and taking off regular upper body clothing?: None Help from another person to put on and taking off regular lower body clothing?: A Little 6 Click Score: 21   End of Session Equipment Utilized During Treatment: Gait belt  Activity Tolerance: Patient tolerated treatment well Patient left: in chair;with call bell/phone within reach;with chair alarm set  OT Visit Diagnosis: Pain;Unsteadiness on feet (R26.81) Pain - Right/Left: Left Pain - part of body: Knee                Time: 1015-1040 OT Time Calculation (min): 25 min Charges:  OT General Charges $OT Visit: 1 Visit OT Evaluation $OT Eval Low Complexity: 1 Low OT Treatments $Self Care/Home Management : 8-22 mins  Chrys Racer, OTR/L,  [place] [facility] [phone] 11/13/18, 12:19 PM

## 2018-11-13 NOTE — Progress Notes (Signed)
Physical Therapy Treatment Patient Details Name: Calvin Burns MRN: [id number] DOB: 09-20-1960 Today's Date: 11/13/2018    History of Present Illness Pt is 58 year old male s/p L TKA. Other history includes GERD and migraines    PT Comments    Pt is making good progress towards goals with increased ambulation distance this session. Safe technique with RW. Good endurance with HEP and reviewed written packet. Pt motivated to perform therapy. Will continue to progress as able.   Follow Up Recommendations  Home health PT     Equipment Recommendations  Rolling walker with 5" wheels;3in1 (PT)    Recommendations for Other Services       Precautions / Restrictions Precautions Precautions: Fall;Knee Precaution Booklet Issued: Yes (comment) Restrictions Weight Bearing Restrictions: Yes LLE Weight Bearing: Weight bearing as tolerated    Mobility  Bed Mobility Overal bed mobility: Needs Assistance Bed Mobility: Supine to Sit     Supine to sit: Supervision     General bed mobility comments: safe technique with ease of transfer.   Transfers Overall transfer level: Needs assistance Equipment used: Rolling walker (2 wheeled) Transfers: Sit to/from Stand Sit to Stand: Min guard         General transfer comment: Upright posture noted. Safe technique performed.  Ambulation/Gait Ambulation/Gait assistance: Supervision Gait Distance (Feet): 200 Feet Assistive device: Rolling walker (2 wheeled) Gait Pattern/deviations: Step-through pattern Gait velocity: ambulated 10' in 7 seconds   General Gait Details: ambulated with RW and reciprocal gait pattern. Cues given for sequencing including symmetrical steps and upright posture.   Stairs             Wheelchair Mobility    Modified Rankin (Stroke Patients Only)       Balance Overall balance assessment: Needs assistance Sitting-balance support: Feet supported Sitting balance-Leahy Scale: Good     Standing balance  support: Bilateral upper extremity supported Standing balance-Leahy Scale: Good                              Cognition Arousal/Alertness: Awake/alert Behavior During Therapy: WFL for tasks assessed/performed Overall Cognitive Status: Within Functional Limits for tasks assessed                                        Exercises Total Joint Exercises Goniometric ROM: L knee AAROM: 0-74 degrees Other Exercises Other Exercises: supine ther-ex performed on L LE including AP, quad sets, SLRs, hip abd/add, and SAQ. All ther-ex performed x 10 reps with cga. Written HEP given and reviewed.    General Comments        Pertinent Vitals/Pain Pain Assessment: 0-10 Pain Score: 5  Pain Location: L knee Pain Descriptors / Indicators: Aching;Discomfort Pain Intervention(s): Limited activity within patient's tolerance;Premedicated before session;Ice applied    Home Living Family/patient expects to be discharged to:: Private residence Living Arrangements: Spouse/significant other;Children Available Help at Discharge: Family Type of Home: House Home Access: Stairs to enter Entrance Stairs-Rails: Can reach both Home Layout: One level Home Equipment: Shower seat;Hand held shower head      Prior Function Level of Independence: Independent      Comments: Pt was independent in all ADLs working full time and plans to have R knee replaced in December. His son (age 60) lives at home  with him and his wife.   PT Goals (current goals  can now be found in the care plan section) Acute Rehab PT Goals Patient Stated Goal: to regain strength and independence so I can have my other knee done in December PT Goal Formulation: With patient Time For Goal Achievement: 11/27/18 Potential to Achieve Goals: Good Additional Goals Additional Goal #1: Pt will be able to perform 4 steps with B rails with supervision in order to safely enter/exit home Progress towards PT goals: Progressing  toward goals    Frequency    BID      PT Plan Current plan remains appropriate    Co-evaluation              AM-PAC PT "6 Clicks" Mobility   Outcome Measure  Help needed turning from your back to your side while in a flat bed without using bedrails?: None Help needed moving from lying on your back to sitting on the side of a flat bed without using bedrails?: None Help needed moving to and from a bed to a chair (including a wheelchair)?: A Little Help needed standing up from a chair using your arms (e.g., wheelchair or bedside chair)?: A Little Help needed to walk in hospital room?: A Little Help needed climbing 3-5 steps with a railing? : A Little 6 Click Score: 20    End of Session Equipment Utilized During Treatment: Gait belt Activity Tolerance: Patient tolerated treatment well Patient left: in chair;with SCD's reapplied(does not need alarm) Nurse Communication: Mobility status PT Visit Diagnosis: Muscle weakness (generalized) (M62.81);Difficulty in walking, not elsewhere classified (R26.2);Pain Pain - Right/Left: Left Pain - part of body: Knee     Time: 1355-1418 PT Time Calculation (min) (ACUTE ONLY): 23 min  Charges:  $Gait Training: 8-22 mins $Therapeutic Exercise: 8-22 mins                     Greggory Stallion, PT, DPT [phone]    Laquasia Pincus 11/13/2018, 2:35 PM

## 2018-11-14 NOTE — Progress Notes (Signed)
Physical Therapy Treatment Patient Details Name: Calvin Burns MRN: [id number] DOB: 1960-12-01 Today's Date: 11/14/2018    History of Present Illness Pt is 58 year old male s/p L TKA. Other history includes GERD and migraines    PT Comments    Pt is making good progress towards goals. Improved AAROM this date. Able to perform stair training safely and pt is eager to dc home this date. Good endurance with there-ex. Will plan for home discharge today, RN notified.   Follow Up Recommendations  Home health PT     Equipment Recommendations  Rolling walker with 5" wheels;3in1 (PT)    Recommendations for Other Services       Precautions / Restrictions Precautions Precautions: Fall;Knee Precaution Booklet Issued: Yes (comment) Restrictions Weight Bearing Restrictions: Yes LLE Weight Bearing: Weight bearing as tolerated    Mobility  Bed Mobility Overal bed mobility: Needs Assistance Bed Mobility: Supine to Sit     Supine to sit: Supervision     General bed mobility comments: safe technique with ease of transfer.   Transfers Overall transfer level: Needs assistance Equipment used: Rolling walker (2 wheeled) Transfers: Sit to/from Stand Sit to Stand: Supervision         General transfer comment: transfer with RW and upright posture. RW adjusted to pt height  Ambulation/Gait Ambulation/Gait assistance: Supervision Gait Distance (Feet): 240 Feet Assistive device: Rolling walker (2 wheeled) Gait Pattern/deviations: Step-through pattern Gait velocity: ambulated 10' in 6 seconds   General Gait Details: ambulated with RW and reciprocal gait pattern. Upright posture with occasional cues to keep head up. Improved gait speed noted   Stairs Stairs: Yes Stairs assistance: Min guard Stair Management: Two rails;Step to pattern Number of Stairs: 4 General stair comments: up/down with railing. Demonstration provided prior to performance.   Wheelchair Mobility     Modified Rankin (Stroke Patients Only)       Balance Overall balance assessment: Needs assistance Sitting-balance support: Feet supported Sitting balance-Leahy Scale: Good     Standing balance support: Bilateral upper extremity supported Standing balance-Leahy Scale: Good                              Cognition Arousal/Alertness: Awake/alert Behavior During Therapy: WFL for tasks assessed/performed Overall Cognitive Status: Within Functional Limits for tasks assessed                                        Exercises Total Joint Exercises Goniometric ROM: L knee AAROM: 0-95 degrees Other Exercises Other Exercises: supine ther-ex performed on L LE including AP, quad sets, SLRs, hip abd/add, seated knee flexion stretches, and LAQ. All ther-ex performed x 12 reps with cga. Written HEP given and reviewed.    General Comments        Pertinent Vitals/Pain Pain Assessment: 0-10 Pain Score: 5  Pain Location: L knee Pain Descriptors / Indicators: Aching;Discomfort Pain Intervention(s): Limited activity within patient's tolerance;Premedicated before session;Ice applied    Home Living                      Prior Function            PT Goals (current goals can now be found in the care plan section) Acute Rehab PT Goals Patient Stated Goal: to regain strength and independence so I can have my other knee  done in December PT Goal Formulation: With patient Time For Goal Achievement: 11/27/18 Potential to Achieve Goals: Good Progress towards PT goals: Progressing toward goals    Frequency    BID      PT Plan Current plan remains appropriate    Co-evaluation              AM-PAC PT "6 Clicks" Mobility   Outcome Measure  Help needed turning from your back to your side while in a flat bed without using bedrails?: None Help needed moving from lying on your back to sitting on the side of a flat bed without using bedrails?:  None Help needed moving to and from a bed to a chair (including a wheelchair)?: A Little Help needed standing up from a chair using your arms (e.g., wheelchair or bedside chair)?: A Little Help needed to walk in hospital room?: A Little Help needed climbing 3-5 steps with a railing? : A Little 6 Click Score: 20    End of Session Equipment Utilized During Treatment: Gait belt Activity Tolerance: Patient tolerated treatment well Patient left: in bed Nurse Communication: Mobility status PT Visit Diagnosis: Muscle weakness (generalized) (M62.81);Difficulty in walking, not elsewhere classified (R26.2);Pain Pain - Right/Left: Left Pain - part of body: Knee     Time: [id number] PT Time Calculation (min) (ACUTE ONLY): 23 min  Charges:  $Gait Training: 8-22 mins $Therapeutic Exercise: 8-22 mins                     Calvin Burns, PT, DPT [phone]    Calvin Burns 11/14/2018, 11:23 AM

## 2018-11-14 NOTE — Progress Notes (Signed)
  Subjective: 2 Days Post-Op Procedure(s) (LRB): COMPUTER ASSISTED TOTAL KNEE ARTHROPLASTY (Left) Patient reports pain as mild.   Patient seen in rounds with Dr. Marry Guan. Patient is well, and has had no acute complaints or problems Plan is to go Home after hospital stay. Negative for chest pain and shortness of breath Fever: no Gastrointestinal: Negative for nausea and vomiting  Objective: Vital signs in last 24 hours: Temp:  [98.5 F (36.9 C)-98.6 F (37 C)] 98.5 F (36.9 C) (09/03 2241) Pulse Rate:  [71-75] 71 (09/03 2241) Resp:  [16-19] 19 (09/03 2241) BP: (130-150)/(82-97) 150/91 (09/03 2241) SpO2:  [98 %-99 %] 99 % (09/03 2241)  Intake/Output from previous day:  Intake/Output Summary (Last 24 hours) at 11/14/2018 0643 Last data filed at 11/14/2018 0522 Gross per 24 hour  Intake 1379.99 ml  Output 560 ml  Net 819.99 ml    Intake/Output this shift: Total I/O In: -  Out: 310 [Drains:310]  Labs: No results for input(s): HGB in the last 72 hours. No results for input(s): WBC, RBC, HCT, PLT in the last 72 hours. No results for input(s): NA, K, CL, CO2, BUN, CREATININE, GLUCOSE, CALCIUM in the last 72 hours. No results for input(s): LABPT, INR in the last 72 hours.   EXAM General - Patient is Alert and Oriented Extremity - Sensation intact distally Intact pulses distally Dorsiflexion/Plantar flexion intact Compartment soft Dressing/Incision - clean, dry, with the Hemovac removed with no complication.  The Hemovac tubing was intact on removal. Motor Function - intact, moving foot and toes well on exam.  Able straight leg raise independently  Past Medical History:  Diagnosis Date  . Asthma   . Complication of anesthesia    Spinal headache that resulted in a blood patch.  Marland Kitchen Dysphagia   . GERD (gastroesophageal reflux disease)   . Headache(784.0)    History of migraines    Assessment/Plan: 2 Days Post-Op Procedure(s) (LRB): COMPUTER ASSISTED TOTAL KNEE  ARTHROPLASTY (Left) Active Problems:   Total knee replacement status  Estimated body mass index is 21.09 kg/m as calculated from the following:   Height as of 11/06/18: 6\' 2"  (1.88 m).   Weight as of 11/06/18: 74.5 kg. Advance diet Up with therapy D/C IV fluids Discharge home with home health planned for today  DVT Prophylaxis - Lovenox, Foot Pumps and TED hose Weight-Bearing as tolerated to left leg  Reche Dixon, PA-C Orthopaedic Surgery 11/14/2018, 6:43 AM

## 2018-11-14 NOTE — Discharge Summary (Signed)
Physician Discharge Summary  Subjective: 2 Days Post-Op Procedure(s) (LRB): COMPUTER ASSISTED TOTAL KNEE ARTHROPLASTY (Left) Patient reports pain as mild.   Patient seen in rounds with Dr. Marry Guan. Patient is well, and has had no acute complaints or problems Patient is ready to go home with home health physical therapy.  Physician Discharge Summary  [name] MRN: [id number] DOB/AGE: Jul 23, 1960 58 y.o.  Admit date: 11/12/2018 Discharge date: 11/14/2018  Admission Diagnoses:  Discharge Diagnoses:  Active Problems:   Total knee replacement status   Discharged Condition: good  Hospital Course: The patient is postop day 2 from a left total knee replacement.  He is done very well since surgery.  His vitals have remained stable.  He has had a bowel movement.  The patient ambulated 200 feet with physical therapy but still needs to do stairs.  His pain level is well controlled.  He is ready to go home with home health physical therapy.  Treatments: surgery:   Left total knee arthroplasty using computer-assisted navigation  SURGEON:  Marciano Sequin. M.D.  ASSISTANT:  Benjaman Lobe, RN (present and scrubbed throughout the case, critical for assistance with exposure, retraction, instrumentation, and closure)  ANESTHESIA: spinal  ESTIMATED BLOOD LOSS: 50 mL  FLUIDS REPLACED: 1200 mL of crystalloid  TOURNIQUET TIME: 103 minutes  DRAINS: 2 medium Hemovac drains  SOFT TISSUE RELEASES: Anterior cruciate ligament, posterior cruciate ligament, deep medial collateral ligament, patellofemoral ligament  IMPLANTS UTILIZED: DePuy Attune size 7 posterior stabilized femoral component (cemented), size 8 rotating platform tibial component (cemented), 41 mm medialized dome patella (cemented), and a 5 mm stabilized rotating platform polyethylene insert.  Discharge Exam: Blood pressure (!) 150/91, pulse 71, temperature 98.5 F (36.9 C), temperature source Oral, resp. rate  19, SpO2 99 %.   Disposition: Discharge disposition: 01-Home or Self Care        Allergies as of 11/14/2018   No Known Allergies     Medication List    STOP taking these medications   etodolac 500 MG tablet Commonly known as: LODINE     TAKE these medications   acetaminophen 650 MG CR tablet Commonly known as: TYLENOL Take 650 mg by mouth every 8 (eight) hours as needed for pain.   albuterol 108 (90 Base) MCG/ACT inhaler Commonly known as: VENTOLIN HFA Inhale 1-2 puffs into the lungs every 6 (six) hours as needed for shortness of breath. Shortness of Breath   celecoxib 200 MG capsule Commonly known as: CELEBREX Take 1 capsule (200 mg total) by mouth 2 (two) times daily.   enoxaparin 40 MG/0.4ML injection Commonly known as: LOVENOX Inject 0.4 mLs (40 mg total) into the skin daily for 14 doses.   fexofenadine 180 MG tablet Commonly known as: ALLEGRA Take 180 mg by mouth daily as needed for allergies or rhinitis.   Fluticasone-Salmeterol 250-50 MCG/DOSE Aepb Commonly known as: ADVAIR Inhale 1 puff into the lungs every evening.   oxyCODONE 5 MG immediate release tablet Commonly known as: Oxy IR/ROXICODONE Take 1 tablet (5 mg total) by mouth every 4 (four) hours as needed for moderate pain (pain score 4-6).   pantoprazole 40 MG tablet Commonly known as: PROTONIX Take 1 tablet (40 mg total) by mouth daily.   traMADol 50 MG tablet Commonly known as: ULTRAM Take 1-2 tablets (50-100 mg total) by mouth every 4 (four) hours as needed for moderate pain.   VISINE OP Apply 1 drop to eye daily as needed (dry eyes).  Durable Medical Equipment  (From admission, onward)         Start     Ordered   11/12/18 2056  DME Walker rolling  Once    Question:  Patient needs a walker to treat with the following condition  Answer:  Total knee replacement status   11/12/18 2055   11/12/18 2056  DME Bedside commode  Once    Question:  Patient needs a bedside  commode to treat with the following condition  Answer:  Total knee replacement status   11/12/18 2055         Follow-up Information    Reche Dixon, PA-C On 11/26/2018.   Specialty: Orthopedic Surgery Why: at 1:45pm Contact information: [place] [address] [phone]        Dereck Leep, MD On 12/25/2018.   Specialty: Orthopedic Surgery Why: at 10:45am Contact information: [place] [address] [phone]           Signed: Prescott Parma, Marquasha Brutus 11/14/2018, 6:46 AM   Objective: Vital signs in last 24 hours: Temp:  [98.5 F (36.9 C)-98.6 F (37 C)] 98.5 F (36.9 C) (09/03 2241) Pulse Rate:  [71-75] 71 (09/03 2241) Resp:  [16-19] 19 (09/03 2241) BP: (130-150)/(82-97) 150/91 (09/03 2241) SpO2:  [98 %-99 %] 99 % (09/03 2241)  Intake/Output from previous day:  Intake/Output Summary (Last 24 hours) at 11/14/2018 0646 Last data filed at 11/14/2018 0522 Gross per 24 hour  Intake 1379.99 ml  Output 560 ml  Net 819.99 ml    Intake/Output this shift: Total I/O In: -  Out: 310 [Drains:310]  Labs: No results for input(s): HGB in the last 72 hours. No results for input(s): WBC, RBC, HCT, PLT in the last 72 hours. No results for input(s): NA, K, CL, CO2, BUN, CREATININE, GLUCOSE, CALCIUM in the last 72 hours. No results for input(s): LABPT, INR in the last 72 hours.  EXAM: General - Patient is Alert and Oriented Extremity - Sensation intact distally Intact pulses distally Dorsiflexion/Plantar flexion intact Compartment soft Incision - clean, dry, with the Hemovac removed.  The Hemovac tubing was intact on removal. Motor Function -plantarflexion and dorsiflexion are intact.  Able to straight leg raise independently.  Assessment/Plan: 2 Days Post-Op Procedure(s) (LRB): COMPUTER ASSISTED TOTAL KNEE ARTHROPLASTY (Left) Procedure(s) (LRB): COMPUTER ASSISTED TOTAL KNEE ARTHROPLASTY (Left) Past Medical  History:  Diagnosis Date  . Asthma   . Complication of anesthesia    Spinal headache that resulted in a blood patch.  Marland Kitchen Dysphagia   . GERD (gastroesophageal reflux disease)   . Headache(784.0)    History of migraines   Active Problems:   Total knee replacement status  Estimated body mass index is 21.09 kg/m as calculated from the following:   Height as of 11/06/18: 6\' 2"  (1.88 m).   Weight as of 11/06/18: 74.5 kg. Advance diet Up with therapy Discharge home with home health Diet - Regular diet Follow up - in 2 weeks Activity - WBAT Disposition - Home Condition Upon Discharge - Stable DVT Prophylaxis - Lovenox and TED hose  Reche Dixon, PA-C Orthopaedic Surgery 11/14/2018, 6:46 AM

## 2018-11-14 NOTE — Progress Notes (Signed)
Discharge instructions reviewed with patient and family. IV removed. Patient belongings returned back to patient. Education given on pain control and use of pain scale to determine medication given.

## 2019-01-08 ENCOUNTER — Other Ambulatory Visit (INDEPENDENT_AMBULATORY_CARE_PROVIDER_SITE_OTHER): Payer: Self-pay | Admitting: Internal Medicine

## 2019-01-08 DIAGNOSIS — K2100 Gastro-esophageal reflux disease with esophagitis, without bleeding: Secondary | ICD-10-CM

## 2019-01-31 NOTE — Discharge Instructions (Signed)
Instructions after Total Knee Replacement   Armari Fussell P. [place]ona Stay, Jr., M.D.     Dept. of Orthopaedics & Sports Medicine  [facility]  [address]  [place], [place]  [address]  Phone: [phone]   Fax: [phone]    DIET: Drink plenty of non-alcoholic fluids. Resume your normal diet. Include foods high in fiber.  ACTIVITY:  You may use crutches or a walker with weight-bearing as tolerated, unless instructed otherwise. You may be weaned off of the walker or crutches by your Physical Therapist.  Do NOT place pillows under the knee. Anything placed under the knee could limit your ability to straighten the knee.   Continue doing gentle exercises. Exercising will reduce the pain and swelling, increase motion, and prevent muscle weakness.   Please continue to use the TED compression stockings for 6 weeks. You may remove the stockings at night, but should reapply them in the morning. Do not drive or operate any equipment until instructed.  WOUND CARE:  Continue to use the PolarCare or ice packs periodically to reduce pain and swelling. You may bathe or shower after the staples are removed at the first office visit following surgery.  MEDICATIONS: You may resume your regular medications. Please take the pain medication as prescribed on the medication. Do not take pain medication on an empty stomach. You have been given a prescription for a blood thinner (Lovenox or Coumadin). Please take the medication as instructed. (NOTE: After completing a 2 week course of Lovenox, take one Enteric-coated aspirin once a day. This along with elevation will help reduce the possibility of phlebitis in your operated leg.) Do not drive or drink alcoholic beverages when taking pain medications.  CALL THE OFFICE FOR: Temperature above 101 degrees Excessive bleeding or drainage on the dressing. Excessive swelling, coldness, or paleness of the toes. Persistent nausea and vomiting.  FOLLOW-UP:  You  should have an appointment to return to the office in 10-14 days after surgery. Arrangements have been made for continuation of Physical Therapy (either home therapy or outpatient therapy).   [facility] Department Directory         [email or web address]       [email or web address]          Cardiology  Appointments: [place] - [phone] [place] - [phone]  Endocrinology  Appointments: [place] - [phone] [place] - [phone]  Gastroenterology  Appointments: [place] - [phone] [place] - [phone]        General Surgery   Appointments: [place] - [phone]  Internal Medicine/Family Medicine  Appointments: [place] - [phone] [place] - [phone] [place] - [phone]  Metabolic and Weigh Loss Surgery  Appointments: [place] - [phone]        Neurology  Appointments: [place] - [phone] [place] - [phone]  Neurosurgery  Appointments: [place] - [phone]  Obstetrics & Gynecology  Appointments: [place] - [phone] [place] - [phone]        Pediatrics  Appointments: [place] - [phone] [place] - [phone]  Physiatry  Appointments: [place] -[phone]  Physical Therapy  Appointments: [place] - [phone] [place] - [phone]        Podiatry  Appointments: [place] - [phone] [place] - [phone]  Pulmonology  Appointments: [place] - [phone]  Rheumatology  Appointments: [place] - [phone]        [place] Location: [facility]  [address] , [place]  [address]  [place] Location: [facility] [address] [place], [place]  [address]  [place] Location: [facility] [address] [place], [place]  [address]    

## 2019-02-16 ENCOUNTER — Other Ambulatory Visit: Payer: Self-pay

## 2019-02-16 ENCOUNTER — Encounter
Admission: RE | Admit: 2019-02-16 | Discharge: 2019-02-16 | Disposition: A | Discharge status: Home / Self Care | Payer: 59 | Source: Ambulatory Visit | Attending: Orthopedic Surgery | Admitting: Orthopedic Surgery

## 2019-02-16 DIAGNOSIS — Z01812 Encounter for preprocedural laboratory examination: Secondary | ICD-10-CM | POA: Diagnosis not present

## 2019-02-16 HISTORY — DX: Other allergic rhinitis: J30.89

## 2019-02-16 HISTORY — DX: Unspecified osteoarthritis, unspecified site: M19.90

## 2019-02-16 HISTORY — DX: Pain in unspecified knee: M25.569

## 2019-02-16 LAB — URINALYSIS, ROUTINE W REFLEX MICROSCOPIC
Bacteria, UA: NONE SEEN
Bilirubin Urine: NEGATIVE
Glucose, UA: NEGATIVE mg/dL
Ketones, ur: NEGATIVE mg/dL
Leukocytes,Ua: NEGATIVE
Nitrite: NEGATIVE
Protein, ur: NEGATIVE mg/dL
Specific Gravity, Urine: 1.011 (ref 1.005–1.030)
Squamous Epithelial / HPF: NONE SEEN (ref 0–5)
pH: 6 (ref 5.0–8.0)

## 2019-02-16 LAB — CBC
HCT: 45.4 % (ref 39.0–52.0)
Hemoglobin: 14.6 g/dL (ref 13.0–17.0)
MCH: 21.3 pg — ABNORMAL LOW (ref 26.0–34.0)
MCHC: 32.2 g/dL (ref 30.0–36.0)
MCV: 66.3 fL — ABNORMAL LOW (ref 80.0–100.0)
Platelets: 313 10*3/uL (ref 150–400)
RBC: 6.85 MIL/uL — ABNORMAL HIGH (ref 4.22–5.81)
RDW: 16.8 % — ABNORMAL HIGH (ref 11.5–15.5)
WBC: 6 10*3/uL (ref 4.0–10.5)
nRBC: 0 % (ref 0.0–0.2)

## 2019-02-16 LAB — HEMOGLOBIN A1C
Hgb A1c MFr Bld: 5.5 % (ref 4.8–5.6)
Mean Plasma Glucose: 111.15 mg/dL

## 2019-02-16 LAB — TYPE AND SCREEN
ABO/RH(D): O POS
Antibody Screen: NEGATIVE

## 2019-02-16 LAB — SEDIMENTATION RATE: Sed Rate: 1 mm/hr (ref 0–20)

## 2019-02-16 LAB — APTT: aPTT: 28 seconds (ref 24–36)

## 2019-02-16 LAB — PROTIME-INR
INR: 1 (ref 0.8–1.2)
Prothrombin Time: 13.4 seconds (ref 11.4–15.2)

## 2019-02-16 LAB — COMPREHENSIVE METABOLIC PANEL
ALT: 15 U/L (ref 0–44)
AST: 16 U/L (ref 15–41)
Albumin: 4 g/dL (ref 3.5–5.0)
Alkaline Phosphatase: 75 U/L (ref 38–126)
Anion gap: 7 (ref 5–15)
BUN: 13 mg/dL (ref 6–20)
CO2: 26 mmol/L (ref 22–32)
Calcium: 8.7 mg/dL — ABNORMAL LOW (ref 8.9–10.3)
Chloride: 102 mmol/L (ref 98–111)
Creatinine, Ser: 0.86 mg/dL (ref 0.61–1.24)
GFR calc Af Amer: >60 mL/min (ref >60)
GFR calc non Af Amer: >60 mL/min (ref >60)
Glucose, Bld: 74 mg/dL (ref 70–99)
Potassium: 3.8 mmol/L (ref 3.5–5.1)
Sodium: 135 mmol/L (ref 135–145)
Total Bilirubin: 0.7 mg/dL (ref 0.3–1.2)
Total Protein: 6.6 g/dL (ref 6.5–8.1)

## 2019-02-16 LAB — C-REACTIVE PROTEIN: CRP: 0.6 mg/dL (ref <1.0)

## 2019-02-16 LAB — SURGICAL PCR SCREEN
MRSA, PCR: NEGATIVE
Staphylococcus aureus: POSITIVE — AB

## 2019-02-16 MED ORDER — ENSURE PRE-SURGERY PO LIQD
296.0000 mL | Freq: Once | ORAL | Status: DC
  Filled 2019-02-16: qty 296

## 2019-02-16 NOTE — Pre-Procedure Instructions (Signed)
Incentive spirometry and carbohydrate drink given along with instructions.

## 2019-02-16 NOTE — Patient Instructions (Addendum)
Your procedure is scheduled on: 02/23/2019 Mon Report to Same Day Surgery 2nd floor medical mall [facility] Entrance-take elevator on left to 2nd floor.  Check in with surgery information desk.) To find out your arrival time please call [phone] between 1PM - 3PM on 02/20/2019 Fri  Remember: Instructions that are not followed completely may result in serious medical risk, up to and including death, or upon the discretion of your surgeon and anesthesiologist your surgery may need to be rescheduled.    _x___ 1. Do not eat food after midnight the night before your procedure. You may drink clear liquids up to 2 hours before you are scheduled to arrive at the hospital for your procedure.  Do not drink clear liquids within 2 hours of your scheduled arrival to the hospital.  Clear liquids include  --Water or Apple juice without pulp  --Clear carbohydrate beverage such as ClearFast or Gatorade  --Black Coffee or Clear Tea (No milk, no creamers, do not add anything to                  the coffee or Tea Type 1 and type 2 diabetics should only drink water.   ____Ensure clear carbohydrate drink on the way to the hospital for bariatric patients  _x_Ensure clear carbohydrate drink 3 hours before surgery. Complete 1.5 hrs before coming to hospital.   No gum chewing or hard candies.     __x__ 2. No Alcohol for 24 hours before or after surgery.   __x__3. No Smoking or e-cigarettes for 24 prior to surgery.  Do not use any chewable tobacco products for at least 6 hour prior to surgery   ____  4. Bring all medications with you on the day of surgery if instructed.    __x__ 5. Notify your doctor if there is any change in your medical condition     (cold, fever, infections).    x___6. On the morning of surgery brush your teeth with toothpaste and water.  You may rinse your mouth with mouth wash if you wish.  Do not swallow any toothpaste or mouthwash.   Do not wear jewelry, make-up, hairpins,  clips or nail polish.  Do not wear lotions, powders, or perfumes. You may wear deodorant.  Do not shave 48 hours prior to surgery. Men may shave face and neck.  Do not bring valuables to the hospital.    [facility] is not responsible for any belongings or valuables.               Contacts, dentures or bridgework may not be worn into surgery.  Leave your suitcase in the car. After surgery it may be brought to your room.  For patients admitted to the hospital, discharge time is determined by your                       treatment team.  _  Patients discharged the day of surgery will not be allowed to drive home.  You will need someone to drive you home and stay with you the night of your procedure.    Please read over the following fact sheets that you were given:   [facility] Preparing for Surgery and or MRSA Information   _x___ Take anti-hypertensive listed below, cardiac, seizure, asthma,     anti-reflux and psychiatric medicines. These include:  1. albuterol (PROVENTIL HFA;VENTOLIN HFA) 108 (90 BASE) MCG/ACT inhaler  2.Fluticasone-Salmeterol (ADVAIR) 250-50 MCG/DOSE AEPB  3.pantoprazole (PROTONIX) 40  MG tablet  4.  5.  6.  ____Fleets enema or Magnesium Citrate as directed.   _x___ Use CHG Soap or sage wipes as directed on instruction sheet   _x___ Use inhalers on the day of surgery and bring to hospital day of surgery  ____ Stop Metformin and Janumet 2 days prior to surgery.    ____ Take 1/2 of usual insulin dose the night before surgery and none on the morning     surgery.   _x___ Follow recommendations from Cardiologist, Pulmonologist or PCP regarding          stopping Aspirin, Coumadin, Plavix ,Eliquis, Effient, or Pradaxa, and Pletal.  X____Stop Anti-inflammatories such as Advil, Aleve, Ibuprofen, Motrin, Naproxen, Naprosyn, Goodies powders or aspirin products. OK to take Tylenol and                          Celebrex.   _x___ Stop supplements until after surgery.  But may  continue Vitamin D, Vitamin B,       and multivitamin.   ____ Bring C-Pap to the hospital.

## 2019-02-17 LAB — URINE CULTURE
Culture: NO GROWTH
Special Requests: NORMAL

## 2019-02-19 ENCOUNTER — Other Ambulatory Visit
Admission: RE | Admit: 2019-02-19 | Discharge: 2019-02-19 | Disposition: A | Discharge status: Home / Self Care | Payer: 59 | Source: Ambulatory Visit | Attending: Orthopedic Surgery | Admitting: Orthopedic Surgery

## 2019-02-19 DIAGNOSIS — Z20828 Contact with and (suspected) exposure to other viral communicable diseases: Secondary | ICD-10-CM | POA: Insufficient documentation

## 2019-02-19 DIAGNOSIS — Z01812 Encounter for preprocedural laboratory examination: Secondary | ICD-10-CM | POA: Insufficient documentation

## 2019-02-19 LAB — SARS CORONAVIRUS 2 (TAT 6-24 HRS): SARS Coronavirus 2: NEGATIVE

## 2019-02-23 ENCOUNTER — Encounter: Admission: RE | Payer: Self-pay | Source: Home / Self Care

## 2019-02-23 ENCOUNTER — Inpatient Hospital Stay: Admission: RE | Admit: 2019-02-23 | Payer: 59 | Source: Home / Self Care | Admitting: Orthopedic Surgery

## 2019-02-23 SURGERY — ARTHROPLASTY, KNEE, TOTAL, USING IMAGELESS COMPUTER-ASSISTED NAVIGATION
Anesthesia: Choice | Site: Knee | Laterality: Right

## 2019-03-16 NOTE — H&P (Signed)
ORTHOPAEDIC HISTORY & PHYSICAL  Progress Notes by Gwenlyn Fudge, PA at 03/12/2019 8:45 AM  [place] [facility] Chief Complaint:       Chief Complaint  Patient presents with  . Knee Pain    H & P RIGHT KNEE    History of Present Illness:    Calvin Burns is a 59 y.o. male that presents to clinic today for his preoperative history and evaluation.  Patient presents unaccompanied. The patient is scheduled to undergo a right total knee arthroplasty on 03/23/19 by Dr. Marry Guan. His pain began 2 to 3 years ago.  The pain is located primarily in the medial aspect of the right knee.   He reports associated increase in pain with weightbearing.  He denies associated numbness or tingling, locking, or giving way of the knee  The patient's symptoms have progressed to the point that they decrease his quality of life. The patient has previously undergone conservative treatment including NSAIDS and injections to the knee without adequate control of his symptoms.  Patient denies history of blood clots or cardiac history.  Of note, patient is also status post left total knee arthroplasty by Dr. Marry Guan on 11/12/2018.  Patient states overall the left knee is doing well.   Past Medical, Surgical, Family, Social History, Allergies, Medications:   Past Medical History:      Past Medical History:  Diagnosis Date  . Arthritis    bilat knees  . GERD (gastroesophageal reflux disease)    More dysphagia  . Hemorrhoids   . Hiatal hernia 2011   Sliding type hiatal hernia noted on barium swallow  . IGT (impaired glucose tolerance) 04/2017   A1c 5.7%  . Migraines   . Mild intermittent asthma   . Osteoarthritis   . Thalassemia     Past Surgical History:       Past Surgical History:  Procedure Laterality Date  . COLONOSCOPY  01/10/2017   Negative  . COLONOSCOPY  04/28/2008   normal.  . EGD  01/10/2012   WITH ESOPHAGEAL DILATION   . EGD  2015  . [place]  . Left total knee arthroplasty using computer-assisted navigation   11/12/2018   Dr Marry Guan  . TONSILLECTOMY  1966  . VASECTOMY  2002  . VASECTOMY      Current Medications:  Current Medications        Current Outpatient Medications  Medication Sig Dispense Refill  . oxyCODONE 5 mg immediate release capsule Take 5 mg by mouth every 4 (four) hours as needed for Pain    . traMADoL (ULTRAM) 50 mg tablet Take 50 mg by mouth every 6 (six) hours as needed for Pain    . valACYclovir (VALTREX) 1000 MG tablet Take 1,000 mg by mouth 2 (two) times daily    . acetaminophen (TYLENOL) 500 MG tablet Take 1,000 mg by mouth every 6 (six) hours as needed for Pain    . acetaminophen (TYLENOL) 650 MG ER tablet Take 1,300 mg by mouth every 8 (eight) hours as needed for Pain    . ADVAIR DISKUS 250-50 mcg/dose diskus inhaler INHALE 1 INHALATION INTO THE LUNGS EVERY DAY 3 Inhaler 1  . albuterol 90 mcg/actuation inhaler Inhale 1 inhalation into the lungs every 4 (four) hours as needed 1 Inhaler 3  . aspirin 81 MG EC tablet Take 81 mg by mouth once daily    . celecoxib (CELEBREX) 200 MG capsule TAKE 1 CAPSULE (200 MG  TOTAL) BY MOUTH ONCE DAILY 30 capsule 1  . etodolac (LODINE) 500 MG tablet Take 500 mg by mouth 2 (two) times daily    . meloxicam (MOBIC) 15 MG tablet Take 15 mg by mouth once daily    . naproxen sodium (ALEVE) 220 MG tablet Take 220 mg by mouth 2 (two) times daily as needed for Pain    . pantoprazole (PROTONIX) 40 MG DR tablet Take 1 tablet (40 mg total) by mouth once daily 90 tablet 1   No current facility-administered medications for this visit.       Allergies: No Known Allergies  Social History:  Social History  Social History        Socioeconomic History  . Marital status: Married    Spouse name: Calvin Burns  . Number of children: 3  . Years of education: 16  . Highest education level:  Bachelor's degree (e.g., BA, AB, BS)  Occupational History  . Occupation: Animator- Pensions consultant    Comment: [employer]  . Financial resource strain: Not on file  . Food insecurity    Worry: Not on file    Inability: Not on file  . Transportation needs    Medical: Not on file    Non-medical: Not on file  Tobacco Use  . Smoking status: Former Smoker    Years: 2.00    Types: Cigarettes    Quit date: 1985    Years since quitting: 36.0  . Smokeless tobacco: Never Used  Substance and Sexual Activity  . Alcohol use: Yes    Alcohol/week: 15.0 - 20.0 standard drinks    Types: 15 - 20 Cans of beer per week    Frequency: 4 or more times a week    Drinks per session: 3 or 4    Binge frequency: Weekly    Comment: 15-20 beers a week  . Drug use: Yes    Types: Marijuana    Comment: +THC  . Sexual activity: Yes    Partners: Female    Birth control/protection: Surgical    Comment: Vasectomy  Lifestyle  . Physical activity    Days per week: Not on file    Minutes per session: Not on file  . Stress: Not on file  Relationships  . Social Herbalist on phone: Not on file    Gets together: Not on file    Attends religious service: Not on file    Active member of club or organization: Not on file    Attends meetings of clubs or organizations: Not on file    Relationship status: Not on file  Other Topics Concern  . Not on file  Social History Narrative  . Not on file      Family History:       Family History  Problem Relation Age of Onset  . Dementia Mother   . Mitral valve prolapse Mother   . Migraines Mother   . Prostate cancer Father   . Thalassemia Father   . No Known Problems Son   . Colon cancer Maternal Grandfather   . Diabetes type II Paternal Grandmother   . Hyperlipidemia (Elevated cholesterol) Paternal Grandmother   . Asthma Paternal Grandfather   . Prostate cancer  Paternal Grandfather   . Stroke Paternal Grandfather   . No Known Problems Son   . No Known Problems Son   . Diabetes Sister   . Obesity Sister   . Thalassemia Brother  Review of Systems:   A 10+ ROS was performed, reviewed, and the pertinent orthopaedic findings are documented in the HPI.    Physical Examination:   BP 124/84   Ht 186.7 cm (6' 1.5")   Wt 77.2 kg (170 lb 3.2 oz)   BMI 22.15 kg/m   Patient is a well-developed, well-nourished male in no acute distress. Patient has normal mood and affect. Patient is alert and oriented to person, place, and time.   HEENT: Atraumatic, normocephalic.  Pupils equal and reactive to light.  Extraocular motion intact.  Noninjected sclera.  Cardiovascular: Regular rate and rhythm, with no murmurs, rubs, or gallops.  Distal pulses palpable.  Respiratory: Lungs clear to auscultation bilaterally.   Range of motion examination of the right hip reveals 110 degrees of flexion. Internal and external rotation of the hip does not elicit pain.  Examination of the right knee reveals skin that is clean and dry.  Mild effusion noted.Range of motion examination of the 131degrees of flexion and 3 degree extension lag. No edema noted. Stable to varus and valgus stresses in full extension. Negative anterior drawer test. Negative posterior drawer test. Tender to palpation along the medial joint line.  Quadriceps tone is fair to good.  Sensation intact over the saphenous, lateral sural cutaneous, superficial fibular, and deep fibular nerve distributions.Distal pulses intact.   Tests Performed/Reviewed:  X-rays   Previous radiographs obtained on 01/29/2019 were reviewed of the right knee and revealed moderate to severe loss of medial joint space with associated osteophyte formation.  Lateral compartment remains relatively well-preserved.  Sunrise view reveals a relatively well-preserved patellofemoral joint space as well is  a left total knee arthroplasty.  No fractures or dislocations noted.   Impression:     ICD-10-CM  1. Primary osteoarthritis of right knee  M17.11  2. Status post total knee replacement, left  [phone]   Plan:   The patient has end-stage degenerative changes of the right knee.  It was explained to the patient that the condition is progressive in nature.  Having failed conservative treatment, the patient has elected to proceed with a total joint arthroplasty.  The patient will undergo a total joint arthroplasty with Dr. Marry Guan.  The risks of surgery, including blood clot and infection, were discussed with the patient.  Measures to reduce these risks, including the use of anticoagulation, perioperative antibiotics, and early ambulation were discussed.  The importance of postoperative physical therapy was discussed with the patient. The patient elects to proceed with surgery. The patient is instructed to stop all blood thinners prior to surgery.  The patient is instructed to call the hospital the day before surgery to learn of the proper arrival time.    Contact our office with any questions or concerns.  Follow up as indicated, or sooner should any new problems arise, if conditions worsen, or if they are otherwise concerned.   Gwenlyn Fudge, PA [facility] [place], [place] [address] Phone: [phone]  This note was generated in part with voice recognition software and I apologize for any typographical errors that were not detected and corrected.     Electronically signed by Gwenlyn Fudge, [place] on 03/16/2019 9:28 PM

## 2019-03-19 ENCOUNTER — Other Ambulatory Visit: Payer: Self-pay

## 2019-03-19 ENCOUNTER — Encounter
Admission: RE | Admit: 2019-03-19 | Discharge: 2019-03-19 | Disposition: A | Discharge status: Home / Self Care | Payer: BC Managed Care – PPO | Source: Ambulatory Visit | Attending: Orthopedic Surgery | Admitting: Orthopedic Surgery

## 2019-03-19 NOTE — Patient Instructions (Signed)
Your procedure is scheduled on: 03/23/19 Report to [place]. To find out your arrival time please call [phone] between 1PM - 3PM on 03/22/19.  Remember: Instructions that are not followed completely may result in serious medical risk, up to and including death, or upon the discretion of your surgeon and anesthesiologist your surgery may need to be rescheduled.     _X__ 1. Do not eat food after midnight the night before your procedure.                 No gum chewing or hard candies. You may drink clear liquids up to 2 hours                 before you are scheduled to arrive for your surgery- DO not drink clear                 liquids within 2 hours of the start of your surgery.                 Clear Liquids include:  water, apple juice without pulp, clear carbohydrate                 drink such as Clearfast or Gatorade, Black Coffee or Tea (Do not add                 anything to coffee or tea). Diabetics water only  __X__2.  On the morning of surgery brush your teeth with toothpaste and water, you                 may rinse your mouth with mouthwash if you wish.  Do not swallow any              toothpaste of mouthwash.     _X__ 3.  No Alcohol for 24 hours before or after surgery.   _X__ 4.  Do Not Smoke or use e-cigarettes For 24 Hours Prior to Your Surgery.                 Do not use any chewable tobacco products for at least 6 hours prior to                 surgery.  ____  5.  Bring all medications with you on the day of surgery if instructed.   __X__  6.  Notify your doctor if there is any change in your medical condition      (cold, fever, infections).     Do not wear jewelry, make-up, hairpins, clips or nail polish. Do not wear lotions, powders, or perfumes.  Do not shave 48 hours prior to surgery. Men may shave face and neck. Do not bring valuables to the hospital.    [facility] is not responsible for any belongings or  valuables.  Contacts, dentures/partials or body piercings may not be worn into surgery. Bring a case for your contacts, glasses or hearing aids, a denture cup will be supplied. Leave your suitcase in the car. After surgery it may be brought to your room. For patients admitted to the hospital, discharge time is determined by your treatment team.   Patients discharged the day of surgery will not be allowed to drive home.   Please read over the following fact sheets that you were given:   MRSA Information  __X__ Take these medicines the morning of surgery with A SIP OF WATER:  1. Pantoprazole  2.   3.   4.  5.  6.  ____ Fleet Enema (as directed)   __X__ Use CHG Soap/SAGE wipes as directed  __X__ Use inhalers on the day of surgery  ____ Stop metformin/Janumet/Farxiga 2 days prior to surgery    ____ Take 1/2 of usual insulin dose the night before surgery. No insulin the morning          of surgery.   ____ Stop Blood Thinners Coumadin/Plavix/Xarelto/Pleta/Pradaxa/Eliquis/Effient/Aspirin  on   Or contact your Surgeon, Cardiologist or Medical Doctor regarding  ability to stop your blood thinners  __X__ Stop Anti-inflammatories 7 days before surgery such as Advil, Ibuprofen, Motrin,  BC or Goodies Powder, Naprosyn, Naproxen, Aleve, Aspirin    __X__ Stop all herbal supplements, fish oil or vitamin E until after surgery.    ____ Bring C-Pap to the hospital.

## 2019-03-20 ENCOUNTER — Other Ambulatory Visit
Admission: RE | Admit: 2019-03-20 | Discharge: 2019-03-20 | Disposition: A | Discharge status: Home / Self Care | Payer: BC Managed Care – PPO | Source: Ambulatory Visit | Attending: Orthopedic Surgery | Admitting: Orthopedic Surgery

## 2019-03-20 DIAGNOSIS — Z01812 Encounter for preprocedural laboratory examination: Secondary | ICD-10-CM | POA: Insufficient documentation

## 2019-03-20 DIAGNOSIS — Z20822 Contact with and (suspected) exposure to covid-19: Secondary | ICD-10-CM | POA: Insufficient documentation

## 2019-03-20 LAB — SARS CORONAVIRUS 2 (TAT 6-24 HRS): SARS Coronavirus 2: NEGATIVE

## 2019-03-23 ENCOUNTER — Ambulatory Visit: Payer: BC Managed Care – PPO | Admitting: Anesthesiology

## 2019-03-23 ENCOUNTER — Encounter
Admission: RE | Disposition: A | Discharge status: Home / Self Care | Payer: Self-pay | Source: Ambulatory Visit | Attending: Orthopedic Surgery

## 2019-03-23 ENCOUNTER — Ambulatory Visit: Payer: BC Managed Care – PPO

## 2019-03-23 ENCOUNTER — Encounter: Payer: Self-pay | Admitting: Orthopedic Surgery

## 2019-03-23 ENCOUNTER — Other Ambulatory Visit: Payer: Self-pay

## 2019-03-23 ENCOUNTER — Ambulatory Visit
Admission: RE | Admit: 2019-03-23 | Discharge: 2019-03-23 | Disposition: A | Discharge status: Home / Self Care | Payer: BC Managed Care – PPO | Source: Ambulatory Visit | Attending: Orthopedic Surgery | Admitting: Orthopedic Surgery

## 2019-03-23 DIAGNOSIS — J4520 Mild intermittent asthma, uncomplicated: Secondary | ICD-10-CM | POA: Insufficient documentation

## 2019-03-23 DIAGNOSIS — Z79899 Other long term (current) drug therapy: Secondary | ICD-10-CM | POA: Insufficient documentation

## 2019-03-23 DIAGNOSIS — Z96659 Presence of unspecified artificial knee joint: Secondary | ICD-10-CM

## 2019-03-23 DIAGNOSIS — Z7982 Long term (current) use of aspirin: Secondary | ICD-10-CM | POA: Insufficient documentation

## 2019-03-23 DIAGNOSIS — K219 Gastro-esophageal reflux disease without esophagitis: Secondary | ICD-10-CM | POA: Insufficient documentation

## 2019-03-23 DIAGNOSIS — M1711 Unilateral primary osteoarthritis, right knee: Secondary | ICD-10-CM | POA: Diagnosis not present

## 2019-03-23 DIAGNOSIS — D569 Thalassemia, unspecified: Secondary | ICD-10-CM | POA: Insufficient documentation

## 2019-03-23 DIAGNOSIS — Z791 Long term (current) use of non-steroidal anti-inflammatories (NSAID): Secondary | ICD-10-CM | POA: Insufficient documentation

## 2019-03-23 DIAGNOSIS — Z96652 Presence of left artificial knee joint: Secondary | ICD-10-CM | POA: Diagnosis not present

## 2019-03-23 DIAGNOSIS — Z96651 Presence of right artificial knee joint: Secondary | ICD-10-CM

## 2019-03-23 HISTORY — PX: KNEE ARTHROPLASTY: SHX992

## 2019-03-23 LAB — URINE DRUG SCREEN, QUALITATIVE (ARMC ONLY)
Amphetamines, Ur Screen: NOT DETECTED
Barbiturates, Ur Screen: NOT DETECTED
Benzodiazepine, Ur Scrn: NOT DETECTED
Cannabinoid 50 Ng, Ur ~~LOC~~: POSITIVE — AB
Cocaine Metabolite,Ur ~~LOC~~: NOT DETECTED
MDMA (Ecstasy)Ur Screen: NOT DETECTED
Methadone Scn, Ur: NOT DETECTED
Opiate, Ur Screen: NOT DETECTED
Phencyclidine (PCP) Ur S: NOT DETECTED
Tricyclic, Ur Screen: NOT DETECTED

## 2019-03-23 LAB — TYPE AND SCREEN
ABO/RH(D): O POS
Antibody Screen: NEGATIVE

## 2019-03-23 SURGERY — ARTHROPLASTY, KNEE, TOTAL, USING IMAGELESS COMPUTER-ASSISTED NAVIGATION
Anesthesia: General | Site: Knee | Laterality: Right

## 2019-03-23 MED ORDER — DIPHENHYDRAMINE HCL 12.5 MG/5ML PO ELIX
12.5000 mg | ORAL_SOLUTION | ORAL | Status: DC | PRN
  Filled 2019-03-23: qty 10

## 2019-03-23 MED ORDER — ONDANSETRON HCL 4 MG/2ML IJ SOLN
INTRAMUSCULAR | Status: AC
  Filled 2019-03-23: qty 2

## 2019-03-23 MED ORDER — LACTATED RINGERS IV SOLN: INTRAVENOUS | Status: DC

## 2019-03-23 MED ORDER — SODIUM CHLORIDE 0.9 % IV SOLN
INTRAVENOUS | Status: DC | PRN
  Administered 2019-03-23: 60 mL

## 2019-03-23 MED ORDER — ENOXAPARIN SODIUM 40 MG/0.4ML ~~LOC~~ SOLN: 40.0000 mg | SUBCUTANEOUS | 0 refills | Status: DC

## 2019-03-23 MED ORDER — FENTANYL CITRATE (PF) 100 MCG/2ML IJ SOLN
INTRAMUSCULAR | Status: AC
  Filled 2019-03-23: qty 2

## 2019-03-23 MED ORDER — GLYCOPYRROLATE 0.2 MG/ML IJ SOLN
INTRAMUSCULAR | Status: DC | PRN
  Administered 2019-03-23: .2 mg via INTRAVENOUS

## 2019-03-23 MED ORDER — PANTOPRAZOLE SODIUM 40 MG PO TBEC: 40.0000 mg | DELAYED_RELEASE_TABLET | Freq: Two times a day (BID) | ORAL | Status: DC

## 2019-03-23 MED ORDER — ACETAMINOPHEN 325 MG PO TABS: 325.0000 mg | ORAL_TABLET | Freq: Four times a day (QID) | ORAL | Status: DC | PRN

## 2019-03-23 MED ORDER — FERROUS SULFATE 325 (65 FE) MG PO TABS: 325.0000 mg | ORAL_TABLET | Freq: Two times a day (BID) | ORAL | Status: DC

## 2019-03-23 MED ORDER — ONDANSETRON HCL 4 MG/2ML IJ SOLN: 4.0000 mg | Freq: Four times a day (QID) | INTRAMUSCULAR | Status: DC | PRN

## 2019-03-23 MED ORDER — MAGNESIUM HYDROXIDE 400 MG/5ML PO SUSP: 30.0000 mL | Freq: Every day | ORAL | Status: DC

## 2019-03-23 MED ORDER — OXYCODONE HCL 5 MG PO TABS: 5.0000 mg | ORAL_TABLET | ORAL | 0 refills | Status: DC | PRN

## 2019-03-23 MED ORDER — CELECOXIB 200 MG PO CAPS
ORAL_CAPSULE | ORAL | Status: AC
  Administered 2019-03-23: 07:00:00 400 mg via ORAL
  Filled 2019-03-23: qty 2

## 2019-03-23 MED ORDER — TRANEXAMIC ACID-NACL 1000-0.7 MG/100ML-% IV SOLN
INTRAVENOUS | Status: AC
  Administered 2019-03-23: 1000 mg via INTRAVENOUS
  Filled 2019-03-23: qty 100

## 2019-03-23 MED ORDER — CELECOXIB 200 MG PO CAPS: 200.0000 mg | ORAL_CAPSULE | Freq: Two times a day (BID) | ORAL | Status: DC

## 2019-03-23 MED ORDER — SODIUM CHLORIDE 0.9 % IV BOLUS
250.0000 mL | Freq: Once | INTRAVENOUS | Status: AC
  Administered 2019-03-23: 250 mL via INTRAVENOUS

## 2019-03-23 MED ORDER — METOCLOPRAMIDE HCL 5 MG/ML IJ SOLN: 5.0000 mg | Freq: Three times a day (TID) | INTRAMUSCULAR | Status: DC | PRN

## 2019-03-23 MED ORDER — DEXAMETHASONE SODIUM PHOSPHATE 10 MG/ML IJ SOLN
INTRAMUSCULAR | Status: AC
  Administered 2019-03-23: 07:00:00 8 mg via INTRAVENOUS
  Filled 2019-03-23: qty 1

## 2019-03-23 MED ORDER — NEOMYCIN-POLYMYXIN B GU 40-200000 IR SOLN
Status: DC | PRN
  Administered 2019-03-23: 14 mL

## 2019-03-23 MED ORDER — METOCLOPRAMIDE HCL 10 MG PO TABS: 10.0000 mg | ORAL_TABLET | Freq: Three times a day (TID) | ORAL | Status: DC

## 2019-03-23 MED ORDER — BUPIVACAINE HCL (PF) 0.5 % IJ SOLN
INTRAMUSCULAR | Status: DC | PRN
  Administered 2019-03-23: 3 mL via INTRATHECAL

## 2019-03-23 MED ORDER — MENTHOL 3 MG MT LOZG: 1.0000 | LOZENGE | OROMUCOSAL | Status: DC | PRN

## 2019-03-23 MED ORDER — KETAMINE HCL 10 MG/ML IJ SOLN
INTRAMUSCULAR | Status: DC | PRN
  Administered 2019-03-23 (×4): 10 mg via INTRAVENOUS

## 2019-03-23 MED ORDER — CELECOXIB 200 MG PO CAPS: 400.0000 mg | ORAL_CAPSULE | Freq: Once | ORAL | Status: AC

## 2019-03-23 MED ORDER — BUPIVACAINE LIPOSOME 1.3 % IJ SUSP
INTRAMUSCULAR | Status: AC
  Filled 2019-03-23: qty 20

## 2019-03-23 MED ORDER — PROPOFOL 10 MG/ML IV BOLUS
INTRAVENOUS | Status: AC
  Filled 2019-03-23: qty 20

## 2019-03-23 MED ORDER — NEOMYCIN-POLYMYXIN B GU 40-200000 IR SOLN
Status: AC
  Filled 2019-03-23: qty 1

## 2019-03-23 MED ORDER — HYDROMORPHONE HCL 1 MG/ML IJ SOLN: 0.5000 mg | INTRAMUSCULAR | Status: DC | PRN

## 2019-03-23 MED ORDER — ENOXAPARIN SODIUM 40 MG/0.4ML ~~LOC~~ SOLN: 40.0000 mg | SUBCUTANEOUS | Status: DC

## 2019-03-23 MED ORDER — TRANEXAMIC ACID-NACL 1000-0.7 MG/100ML-% IV SOLN
INTRAVENOUS | Status: AC
  Filled 2019-03-23: qty 100

## 2019-03-23 MED ORDER — FLEET ENEMA 7-19 GM/118ML RE ENEM: 1.0000 | ENEMA | Freq: Once | RECTAL | Status: DC | PRN

## 2019-03-23 MED ORDER — GABAPENTIN 300 MG PO CAPS: 300.0000 mg | ORAL_CAPSULE | Freq: Once | ORAL | Status: AC

## 2019-03-23 MED ORDER — SODIUM CHLORIDE FLUSH 0.9 % IV SOLN
INTRAVENOUS | Status: AC
  Filled 2019-03-23: qty 40

## 2019-03-23 MED ORDER — ENSURE PRE-SURGERY PO LIQD
296.0000 mL | Freq: Once | ORAL | Status: DC
  Filled 2019-03-23: qty 296

## 2019-03-23 MED ORDER — ALUM & MAG HYDROXIDE-SIMETH 200-200-20 MG/5ML PO SUSP: 30.0000 mL | ORAL | Status: DC | PRN

## 2019-03-23 MED ORDER — SODIUM CHLORIDE 0.9 % IV SOLN
INTRAVENOUS | Status: DC | PRN
  Administered 2019-03-23: 08:00:00 30 ug/min via INTRAVENOUS

## 2019-03-23 MED ORDER — PROPOFOL 10 MG/ML IV BOLUS
INTRAVENOUS | Status: DC | PRN
  Administered 2019-03-23: 75 ug/kg/min via INTRAVENOUS
  Administered 2019-03-23: 150 mg via INTRAVENOUS
  Administered 2019-03-23: 20 mg via INTRAVENOUS

## 2019-03-23 MED ORDER — CEFAZOLIN SODIUM-DEXTROSE 2-4 GM/100ML-% IV SOLN: 2.0000 g | Freq: Four times a day (QID) | INTRAVENOUS | Status: DC

## 2019-03-23 MED ORDER — MIDAZOLAM HCL 5 MG/5ML IJ SOLN
INTRAMUSCULAR | Status: DC | PRN
  Administered 2019-03-23 (×3): 1 mg via INTRAVENOUS

## 2019-03-23 MED ORDER — SENNOSIDES-DOCUSATE SODIUM 8.6-50 MG PO TABS: 1.0000 | ORAL_TABLET | Freq: Two times a day (BID) | ORAL | Status: DC

## 2019-03-23 MED ORDER — FENTANYL CITRATE (PF) 100 MCG/2ML IJ SOLN
INTRAMUSCULAR | Status: DC | PRN
  Administered 2019-03-23 (×2): 50 ug via INTRAVENOUS

## 2019-03-23 MED ORDER — KETAMINE HCL 50 MG/ML IJ SOLN
INTRAMUSCULAR | Status: AC
  Filled 2019-03-23: qty 10

## 2019-03-23 MED ORDER — MIDAZOLAM HCL 2 MG/2ML IJ SOLN
INTRAMUSCULAR | Status: AC
  Filled 2019-03-23: qty 2

## 2019-03-23 MED ORDER — CEFAZOLIN SODIUM-DEXTROSE 2-4 GM/100ML-% IV SOLN
2.0000 g | INTRAVENOUS | Status: AC
  Administered 2019-03-23: 2 g via INTRAVENOUS

## 2019-03-23 MED ORDER — ONDANSETRON HCL 4 MG/2ML IJ SOLN
INTRAMUSCULAR | Status: DC | PRN
  Administered 2019-03-23: 4 mg via INTRAVENOUS

## 2019-03-23 MED ORDER — FENTANYL CITRATE (PF) 100 MCG/2ML IJ SOLN: 25.0000 ug | INTRAMUSCULAR | Status: DC | PRN

## 2019-03-23 MED ORDER — GLYCOPYRROLATE 0.2 MG/ML IJ SOLN
INTRAMUSCULAR | Status: AC
  Filled 2019-03-23: qty 1

## 2019-03-23 MED ORDER — PROPOFOL 500 MG/50ML IV EMUL
INTRAVENOUS | Status: AC
  Filled 2019-03-23: qty 50

## 2019-03-23 MED ORDER — ONDANSETRON HCL 4 MG/2ML IJ SOLN
INTRAMUSCULAR | Status: AC
  Administered 2019-03-23: 4 mg via INTRAVENOUS
  Filled 2019-03-23: qty 2

## 2019-03-23 MED ORDER — OXYCODONE HCL 5 MG PO TABS
5.0000 mg | ORAL_TABLET | ORAL | Status: DC | PRN
  Filled 2019-03-23: qty 1

## 2019-03-23 MED ORDER — PHENOL 1.4 % MT LIQD: 1.0000 | OROMUCOSAL | Status: DC | PRN

## 2019-03-23 MED ORDER — BISACODYL 10 MG RE SUPP: 10.0000 mg | Freq: Every day | RECTAL | Status: DC | PRN

## 2019-03-23 MED ORDER — CEFAZOLIN SODIUM-DEXTROSE 2-4 GM/100ML-% IV SOLN
INTRAVENOUS | Status: AC
  Filled 2019-03-23: qty 100

## 2019-03-23 MED ORDER — SODIUM CHLORIDE 0.9 % IV SOLN: INTRAVENOUS | Status: DC

## 2019-03-23 MED ORDER — SODIUM CHLORIDE 0.9 % IV BOLUS
250.0000 mL | Freq: Once | INTRAVENOUS | Status: AC
  Administered 2019-03-23: 12:00:00 250 mL via INTRAVENOUS

## 2019-03-23 MED ORDER — CEFAZOLIN SODIUM-DEXTROSE 2-4 GM/100ML-% IV SOLN
INTRAVENOUS | Status: AC
  Administered 2019-03-23: 2 g via INTRAVENOUS
  Filled 2019-03-23: qty 100

## 2019-03-23 MED ORDER — BUPIVACAINE HCL (PF) 0.25 % IJ SOLN
INTRAMUSCULAR | Status: DC | PRN
  Administered 2019-03-23: 60 mL

## 2019-03-23 MED ORDER — ACETAMINOPHEN 10 MG/ML IV SOLN
INTRAVENOUS | Status: AC
  Filled 2019-03-23: qty 100

## 2019-03-23 MED ORDER — TRAMADOL HCL 50 MG PO TABS: 50.0000 mg | ORAL_TABLET | ORAL | Status: DC | PRN

## 2019-03-23 MED ORDER — BUPIVACAINE HCL (PF) 0.25 % IJ SOLN
INTRAMUSCULAR | Status: AC
  Filled 2019-03-23: qty 60

## 2019-03-23 MED ORDER — PHENYLEPHRINE HCL (PRESSORS) 10 MG/ML IV SOLN
INTRAVENOUS | Status: DC | PRN
  Administered 2019-03-23 (×2): 100 ug via INTRAVENOUS

## 2019-03-23 MED ORDER — OXYCODONE HCL 5 MG PO TABS
10.0000 mg | ORAL_TABLET | ORAL | Status: DC | PRN
  Filled 2019-03-23: qty 2

## 2019-03-23 MED ORDER — ONDANSETRON HCL 4 MG PO TABS: 4.0000 mg | ORAL_TABLET | Freq: Four times a day (QID) | ORAL | Status: DC | PRN

## 2019-03-23 MED ORDER — ACETAMINOPHEN 10 MG/ML IV SOLN: 1000.0000 mg | Freq: Four times a day (QID) | INTRAVENOUS | Status: DC

## 2019-03-23 MED ORDER — GABAPENTIN 300 MG PO CAPS
ORAL_CAPSULE | ORAL | Status: AC
  Administered 2019-03-23: 300 mg via ORAL
  Filled 2019-03-23: qty 1

## 2019-03-23 MED ORDER — METOCLOPRAMIDE HCL 10 MG PO TABS: 5.0000 mg | ORAL_TABLET | Freq: Three times a day (TID) | ORAL | Status: DC | PRN

## 2019-03-23 MED ORDER — TRAMADOL HCL 50 MG PO TABS: 50.0000 mg | ORAL_TABLET | Freq: Four times a day (QID) | ORAL | 0 refills | Status: DC | PRN

## 2019-03-23 MED ORDER — TRANEXAMIC ACID-NACL 1000-0.7 MG/100ML-% IV SOLN: 1000.0000 mg | Freq: Once | INTRAVENOUS | Status: AC

## 2019-03-23 MED ORDER — ACETAMINOPHEN 10 MG/ML IV SOLN
INTRAVENOUS | Status: DC | PRN
  Administered 2019-03-23: 1000 mg via INTRAVENOUS

## 2019-03-23 MED ORDER — TRANEXAMIC ACID-NACL 1000-0.7 MG/100ML-% IV SOLN
1000.0000 mg | INTRAVENOUS | Status: AC
  Administered 2019-03-23: 1000 mg via INTRAVENOUS

## 2019-03-23 MED ORDER — CHLORHEXIDINE GLUCONATE 4 % EX LIQD: 60.0000 mL | Freq: Once | CUTANEOUS | Status: DC

## 2019-03-23 MED ORDER — OXYCODONE HCL 5 MG/5ML PO SOLN: 5.0000 mg | Freq: Once | ORAL | Status: DC | PRN

## 2019-03-23 MED ORDER — OXYCODONE HCL 5 MG PO TABS: 5.0000 mg | ORAL_TABLET | Freq: Once | ORAL | Status: DC | PRN

## 2019-03-23 MED ORDER — DEXAMETHASONE SODIUM PHOSPHATE 10 MG/ML IJ SOLN: 8.0000 mg | Freq: Once | INTRAMUSCULAR | Status: AC

## 2019-03-23 SURGICAL SUPPLY — 76 items
ATTUNE MED DOME PAT 41 KNEE (Knees) ×1 IMPLANT
ATTUNE MED DOME PAT 41MM KNEE (Knees) ×1 IMPLANT
ATTUNE PS FEM RT SZ 7 CEM KNEE (Femur) ×2 IMPLANT
ATTUNE PSRP INSR SZ7 5 KNEE (Insert) ×1 IMPLANT
ATTUNE PSRP INSR SZ7 5MM KNEE (Insert) ×1 IMPLANT
BASE TIBIAL ROT PLAT SZ 8 KNEE (Knees) IMPLANT
BATTERY INSTRU NAVIGATION (MISCELLANEOUS) ×12 IMPLANT
BLADE SAW 70X12.5 (BLADE) ×3 IMPLANT
BLADE SAW 90X13X1.19 OSCILLAT (BLADE) ×3 IMPLANT
BLADE SAW 90X25X1.19 OSCILLAT (BLADE) ×3 IMPLANT
CANISTER SUCT 3000ML PPV (MISCELLANEOUS) ×3 IMPLANT
CEMENT HV SMART SET (Cement) ×4 IMPLANT
COOLER POLAR GLACIER W/PUMP (MISCELLANEOUS) ×3 IMPLANT
COVER LIGHT HANDLE STERIS (MISCELLANEOUS) ×2 IMPLANT
COVER WAND RF STERILE (DRAPES) ×3 IMPLANT
CUFF TOURN SGL QUICK 24 (TOURNIQUET CUFF) ×2
CUFF TOURN SGL QUICK 30 (TOURNIQUET CUFF)
CUFF TRNQT CYL 24X4X16.5-23 (TOURNIQUET CUFF) IMPLANT
CUFF TRNQT CYL 30X4X21-28X (TOURNIQUET CUFF) IMPLANT
DRAPE 3/4 80X56 (DRAPES) ×3 IMPLANT
DRSG DERMACEA 8X12 NADH (GAUZE/BANDAGES/DRESSINGS) ×3 IMPLANT
DRSG OPSITE POSTOP 4X14 (GAUZE/BANDAGES/DRESSINGS) ×3 IMPLANT
DRSG TEGADERM 4X4.75 (GAUZE/BANDAGES/DRESSINGS) ×3 IMPLANT
DURAPREP 26ML APPLICATOR (WOUND CARE) ×6 IMPLANT
ELECT REM PT RETURN 9FT ADLT (ELECTROSURGICAL) ×3
ELECTRODE REM PT RTRN 9FT ADLT (ELECTROSURGICAL) ×1 IMPLANT
EX-PIN ORTHOLOCK NAV 4X150 (PIN) ×6 IMPLANT
GLOVE BIO SURGEON STRL SZ7.5 (GLOVE) ×6 IMPLANT
GLOVE BIOGEL M STRL SZ7.5 (GLOVE) ×6 IMPLANT
GLOVE BIOGEL PI IND STRL 7.5 (GLOVE) ×1 IMPLANT
GLOVE BIOGEL PI INDICATOR 7.5 (GLOVE) ×2
GLOVE INDICATOR 8.0 STRL GRN (GLOVE) ×3 IMPLANT
GOWN STRL REUS W/ TWL LRG LVL3 (GOWN DISPOSABLE) ×2 IMPLANT
GOWN STRL REUS W/ TWL XL LVL3 (GOWN DISPOSABLE) ×1 IMPLANT
GOWN STRL REUS W/TWL LRG LVL3 (GOWN DISPOSABLE) ×4
GOWN STRL REUS W/TWL XL LVL3 (GOWN DISPOSABLE) ×2
HEMOVAC 400CC 10FR (MISCELLANEOUS) ×3 IMPLANT
HOLDER FOLEY CATH W/STRAP (MISCELLANEOUS) ×3 IMPLANT
HOOD PEEL AWAY FLYTE STAYCOOL (MISCELLANEOUS) ×6 IMPLANT
KIT TURNOVER KIT A (KITS) ×3 IMPLANT
KNIFE SCULPS 14X20 (INSTRUMENTS) ×3 IMPLANT
LABEL OR SOLS (LABEL) ×3 IMPLANT
MANIFOLD NEPTUNE II (INSTRUMENTS) ×3 IMPLANT
NDL SAFETY ECLIPSE 18X1.5 (NEEDLE) ×1 IMPLANT
NDL SPNL 20GX3.5 QUINCKE YW (NEEDLE) ×2 IMPLANT
NEEDLE HYPO 18GX1.5 SHARP (NEEDLE) ×2
NEEDLE SPNL 20GX3.5 QUINCKE YW (NEEDLE) ×6 IMPLANT
NS IRRIG 500ML POUR BTL (IV SOLUTION) ×3 IMPLANT
PACK TOTAL KNEE (MISCELLANEOUS) ×3 IMPLANT
PAD WRAPON POLAR KNEE (MISCELLANEOUS) ×1 IMPLANT
PENCIL SMOKE EVACUATOR COATED (MISCELLANEOUS) ×3 IMPLANT
PENCIL SMOKE ULTRAEVAC 22 CON (MISCELLANEOUS) ×3 IMPLANT
PIN DRILL QUICK PACK ×3 IMPLANT
PIN FIXATION 1/8DIA X 3INL (PIN) ×9 IMPLANT
PULSAVAC PLUS IRRIG FAN TIP (DISPOSABLE) ×3
SOL .9 NS 3000ML IRR  AL (IV SOLUTION) ×2
SOL .9 NS 3000ML IRR UROMATIC (IV SOLUTION) ×1 IMPLANT
SOL PREP PVP 2OZ (MISCELLANEOUS) ×3
SOLUTION PREP PVP 2OZ (MISCELLANEOUS) ×1 IMPLANT
SPONGE DRAIN TRACH 4X4 STRL 2S (GAUZE/BANDAGES/DRESSINGS) ×3 IMPLANT
STAPLER SKIN PROX 35W (STAPLE) ×3 IMPLANT
STOCKINETTE IMPERV 14X48 (MISCELLANEOUS) ×2 IMPLANT
STRAP TIBIA SHORT (MISCELLANEOUS) ×3 IMPLANT
SUCTION FRAZIER HANDLE 10FR (MISCELLANEOUS) ×2
SUCTION TUBE FRAZIER 10FR DISP (MISCELLANEOUS) ×1 IMPLANT
SUT VIC AB 0 CT1 36 (SUTURE) ×6 IMPLANT
SUT VIC AB 1 CT1 36 (SUTURE) ×6 IMPLANT
SUT VIC AB 2-0 CT2 27 (SUTURE) ×3 IMPLANT
SYR 20ML LL LF (SYRINGE) ×3 IMPLANT
SYR 30ML LL (SYRINGE) ×6 IMPLANT
TIBIAL BASE ROT PLAT SZ 8 KNEE (Knees) ×3 IMPLANT
TIP FAN IRRIG PULSAVAC PLUS (DISPOSABLE) ×1 IMPLANT
TOWEL OR 17X26 4PK STRL BLUE (TOWEL DISPOSABLE) ×3 IMPLANT
TOWER CARTRIDGE SMART MIX (DISPOSABLE) ×3 IMPLANT
TRAY FOLEY MTR SLVR 16FR STAT (SET/KITS/TRAYS/PACK) ×3 IMPLANT
WRAPON POLAR PAD KNEE (MISCELLANEOUS) ×3

## 2019-03-23 NOTE — Transfer of Care (Signed)
Immediate Anesthesia Transfer of Care Note  Patient: Calvin Burns  Procedure(s) Performed: COMPUTER ASSISTED TOTAL KNEE ARTHROPLASTY (Right Knee)  Patient Location: PACU  Anesthesia Type:General and Spinal  Level of Consciousness: awake, alert  and oriented  Airway & Oxygen Therapy: Patient Spontanous Breathing and Patient connected to face mask oxygen  Post-op Assessment: Report given to RN and Post -op Vital signs reviewed and stable  Post vital signs: Reviewed  Last Vitals:  Vitals Value Taken Time  BP 106/76 03/23/19 1109  Temp 36.6 C 03/23/19 1109  Pulse 82 03/23/19 1113  Resp 14 03/23/19 1113  SpO2 100 % 03/23/19 1113  Vitals shown include unvalidated device data.  Last Pain:  Vitals:   03/23/19 1109  TempSrc:   PainSc: Asleep         Complications: No apparent anesthesia complications

## 2019-03-23 NOTE — Anesthesia Procedure Notes (Signed)
Procedure Name: LMA Insertion Date/Time: 03/23/2019 7:45 AM Performed by: Marsh Dolly, CRNA Pre-anesthesia Checklist: Patient identified, Patient being monitored, Timeout performed, Emergency Drugs available and Suction available Patient Re-evaluated:Patient Re-evaluated prior to induction Oxygen Delivery Method: Circle system utilized Preoxygenation: Pre-oxygenation with 100% oxygen Induction Type: IV induction Ventilation: Mask ventilation without difficulty LMA: LMA inserted LMA Size: 4.5 Tube type: Oral Number of attempts: 1 Placement Confirmation: positive ETCO2 and breath sounds checked- equal and bilateral Tube secured with: Tape Dental Injury: Teeth and Oropharynx as per pre-operative assessment

## 2019-03-23 NOTE — Op Note (Signed)
OPERATIVE NOTE  DATE OF SURGERY:  03/23/2019  PATIENT NAME:  Calvin Burns   DOB: 03-30-1960  MRN: [id number]  PRE-OPERATIVE DIAGNOSIS: Degenerative arthrosis of the right knee, primary  POST-OPERATIVE DIAGNOSIS:  Same  PROCEDURE:  Right total knee arthroplasty using computer-assisted navigation  SURGEON:  Marciano Sequin. M.D.  ASSISTANT: Cassell Smiles, PA-C (present and scrubbed throughout the case, critical for assistance with exposure, retraction, instrumentation, and closure)  ANESTHESIA: spinal and general  ESTIMATED BLOOD LOSS: 50 mL  FLUIDS REPLACED: 1000 mL of crystalloid  TOURNIQUET TIME: 101 minutes  DRAINS: 2 medium Hemovac drains  SOFT TISSUE RELEASES: Anterior cruciate ligament, posterior cruciate ligament, deep medial collateral ligament, patellofemoral ligament  IMPLANTS UTILIZED: DePuy Attune size 7 posterior stabilized femoral component (cemented), size 8 rotating platform tibial component (cemented), 41 mm medialized dome patella (cemented), and a 5 mm stabilized rotating platform polyethylene insert.  INDICATIONS FOR SURGERY: [name] is a 59 y.o. year old male with a long history of progressive knee pain. X-rays demonstrated severe degenerative changes in tricompartmental fashion. The patient had not seen any significant improvement despite conservative nonsurgical intervention. After discussion of the risks and benefits of surgical intervention, the patient expressed understanding of the risks benefits and agree with plans for total knee arthroplasty.   The risks, benefits, and alternatives were discussed at length including but not limited to the risks of infection, bleeding, nerve injury, stiffness, blood clots, the need for revision surgery, cardiopulmonary complications, among others, and they were willing to proceed.  PROCEDURE IN DETAIL: The patient was brought into the operating room and, after adequate spinal and general anesthesia was achieved, a  tourniquet was placed on the patient's upper thigh. The patient's knee and leg were cleaned and prepped with alcohol and DuraPrep and draped in the usual sterile fashion. A "timeout" was performed as per usual protocol. The lower extremity was exsanguinated using an Esmarch, and the tourniquet was inflated to 300 mmHg. An anterior longitudinal incision was made followed by a standard mid vastus approach. The deep fibers of the medial collateral ligament were elevated in a subperiosteal fashion off of the medial flare of the tibia so as to maintain a continuous soft tissue sleeve. The patella was subluxed laterally and the patellofemoral ligament was incised. Inspection of the knee demonstrated severe degenerative changes with full-thickness loss of articular cartilage. Osteophytes were debrided using a rongeur. Anterior and posterior cruciate ligaments were excised. Two 4.0 mm Schanz pins were inserted in the femur and into the tibia for attachment of the array of trackers used for computer-assisted navigation. Hip center was identified using a circumduction technique. Distal landmarks were mapped using the computer. The distal femur and proximal tibia were mapped using the computer. The distal femoral cutting guide was positioned using computer-assisted navigation so as to achieve a 5 distal valgus cut. The femur was sized and it was felt that a size 7 femoral component was appropriate. A size 7 femoral cutting guide was positioned and the anterior cut was performed and verified using the computer. This was followed by completion of the posterior and chamfer cuts. Femoral cutting guide for the central box was then positioned in the center box cut was performed.  Attention was then directed to the proximal tibia. Medial and lateral menisci were excised. The extramedullary tibial cutting guide was positioned using computer-assisted navigation so as to achieve a 0 varus-valgus alignment and 3 posterior slope. The  cut was performed and verified using the computer.  The proximal tibia was sized and it was felt that a size 8 tibial tray was appropriate. Tibial and femoral trials were inserted followed by insertion of a 5 mm polyethylene insert. This allowed for excellent mediolateral soft tissue balancing both in flexion and in full extension. Finally, the patella was cut and prepared so as to accommodate a 41 mm medialized dome patella. A patella trial was placed and the knee was placed through a range of motion with excellent patellar tracking appreciated. The femoral trial was removed after debridement of posterior osteophytes. The central post-hole for the tibial component was reamed followed by insertion of a keel punch. Tibial trials were then removed. Cut surfaces of bone were irrigated with copious amounts of normal saline with antibiotic solution using pulsatile lavage and then suctioned dry. Polymethylmethacrylate cement was prepared in the usual fashion using a vacuum mixer. Cement was applied to the cut surface of the proximal tibia as well as along the undersurface of a size 8 rotating platform tibial component. Tibial component was positioned and impacted into place. Excess cement was removed using [employer] fast streamer. Cement was then applied to the cut surfaces of the femur as well as along the posterior flanges of the size 7 femoral component. The femoral component was positioned and impacted into place. Excess cement was removed using [employer] fast streamer. A 5 mm polyethylene trial was inserted and the knee was brought into full extension with steady axial compression applied. Finally, cement was applied to the backside of a 41 mm medialized dome patella and the patellar component was positioned and patellar clamp applied. Excess cement was removed using [employer] fast streamer. After adequate curing of the cement, the tourniquet was deflated after a total tourniquet time of 101 minutes. Hemostasis was achieved using  electrocautery. The knee was irrigated with copious amounts of normal saline with antibiotic solution using pulsatile lavage and then suctioned dry. 20 mL of 1.3% Exparel and 60 mL of 0.25% Marcaine in 40 mL of normal saline was injected along the posterior capsule, medial and lateral gutters, and along the arthrotomy site. A 5 mm stabilized rotating platform polyethylene insert was inserted and the knee was placed through a range of motion with excellent mediolateral soft tissue balancing appreciated and excellent patellar tracking noted. 2 medium drains were placed in the wound bed and brought out through separate stab incisions. The medial parapatellar portion of the incision was reapproximated using interrupted sutures of #1 Vicryl. Subcutaneous tissue was approximated in layers using first #0 Vicryl followed #2-0 Vicryl. The skin was approximated with skin staples. A sterile dressing was applied.  The patient tolerated the procedure well and was transported to the recovery room in stable condition.    Yaquelin Langelier P. Holley Bouche., M.D.

## 2019-03-23 NOTE — H&P (Signed)
The patient has been re-examined, and the chart reviewed, and there have been no interval changes to the documented history and physical.    The risks, benefits, and alternatives have been discussed at length. The patient expressed understanding of the risks benefits and agreed with plans for surgical intervention.  Charmion Hapke P. Dealva Lafoy, Jr. M.D.    

## 2019-03-23 NOTE — Discharge Instructions (Addendum)
Instructions after Total Knee Replacement   James P. Hooten, Jr., M.D.     Dept. of Orthopaedics & Sports Medicine  [facility]  [address]  [place], [place]  [address]  Phone: [phone]   Fax: [phone]    DIET: . Drink plenty of non-alcoholic fluids. . Resume your normal diet. Include foods high in fiber.  ACTIVITY:  . You may use crutches or a walker with weight-bearing as tolerated, unless instructed otherwise. . You may be weaned off of the walker or crutches by your Physical Therapist.  . Do NOT place pillows under the knee. Anything placed under the knee could limit your ability to straighten the knee.   . Continue doing gentle exercises. Exercising will reduce the pain and swelling, increase motion, and prevent muscle weakness.   . Please continue to use the TED compression stockings for 6 weeks. You may remove the stockings at night, but should reapply them in the morning. . Do not drive or operate any equipment until instructed.  WOUND CARE:  . Continue to use the PolarCare or ice packs periodically to reduce pain and swelling. . You may bathe or shower after the staples are removed at the first office visit following surgery.  MEDICATIONS: . You may resume your regular medications. . Please take the pain medication as prescribed on the medication. . Do not take pain medication on an empty stomach. . You have been given a prescription for a blood thinner (Lovenox or Coumadin). Please take the medication as instructed. (NOTE: After completing a 2 week course of Lovenox, take one Enteric-coated aspirin once a day. This along with elevation will help reduce the possibility of phlebitis in your operated leg.) . Do not drive or drink alcoholic beverages when taking pain medications.  CALL THE OFFICE FOR: . Temperature above 101 degrees . Excessive bleeding or drainage on the dressing. . Excessive swelling, coldness, or paleness of the toes. . Persistent  nausea and vomiting.  FOLLOW-UP:  . You should have an appointment to return to the office in 10-14 days after surgery. . Arrangements have been made for continuation of Physical Therapy (either home therapy or outpatient therapy).     [facility] Department Directory         [email or web address]       [email or web address]          Cardiology  Appointments: [place] - [phone] [place] - [phone]  Endocrinology  Appointments: [place] - [phone] [place] - [phone]  Gastroenterology  Appointments: [place] - [phone] [place] - [phone]        General Surgery   Appointments: [place] - [phone]  Internal Medicine/Family Medicine  Appointments: [place] - [phone] [place] - [phone] [place] - [phone]  Metabolic and Weigh Loss Surgery  Appointments: [place] - [phone]        Neurology  Appointments: [place] - [phone] [place] - [phone]  Neurosurgery  Appointments: [place] - [phone]  Obstetrics & Gynecology  Appointments: [place] - [phone] [place] - [phone]        Pediatrics  Appointments: [place] - [phone] [place] - [phone]  Physiatry  Appointments: [place] -[phone]  Physical Therapy  Appointments: [place] - [phone] [place] - [phone]        Podiatry  Appointments: [place] - [phone] [place] - [phone]  Pulmonology  Appointments: [place] - [phone]  Rheumatology  Appointments: [place] - [phone]        [place] Location: [facility]  [address] [place], [place]  [address]  [place] Location: [facility] [address] [place], [place]  [address]  [place] Location: [facility] [address] [place], [place]  [address]      AMBULATORY SURGERY  DISCHARGE INSTRUCTIONS   1) The drugs that you were given will stay in your system until tomorrow so for  the next 24 hours you should not:  A) Drive an automobile B) Make any legal decisions C) Drink any alcoholic beverage   2) You may resume regular meals tomorrow.  Today it is better to start with liquids and gradually work up to solid foods.  You may eat anything you prefer, but it is better to start with liquids, then soup and crackers, and gradually work up to solid foods.   3) Please notify your doctor immediately if you have any unusual bleeding, trouble breathing, redness and pain at the surgery site, drainage, fever, or pain not relieved by medication.    4) Additional Instructions:        Please contact your physician with any problems or Same Day Surgery at [phone], Monday through Friday 6 am to 4 pm, or [place] at [place] [facility] number at [phone]. 

## 2019-03-23 NOTE — Anesthesia Procedure Notes (Signed)
Spinal  Patient location during procedure: OR Start time: 03/23/2019 7:22 AM Staffing Performed: resident/CRNA  Preanesthetic Checklist Completed: patient identified, IV checked, site marked, risks and benefits discussed, surgical consent, monitors and equipment checked, pre-op evaluation and timeout performed Spinal Block Patient position: sitting Prep: ChloraPrep Patient monitoring: heart rate, cardiac monitor, continuous pulse ox and blood pressure Approach: midline Location: L3-4 Injection technique: single-shot Needle Needle type: Sprotte  Needle gauge: 24 G Needle length: 9 cm Assessment Sensory level: T4

## 2019-03-23 NOTE — OR Nursing (Signed)
PT/OT evals completed in PACU; pt voided pacu per pacu RN.  Bone foam given to pt for home use.   Has DME at home already per pt.

## 2019-03-23 NOTE — Anesthesia Postprocedure Evaluation (Signed)
Anesthesia Post Note  Patient: Calvin Burns  Procedure(s) Performed: COMPUTER ASSISTED TOTAL KNEE ARTHROPLASTY (Right Knee)  Patient location during evaluation: PACU Anesthesia Type: Spinal and General Level of consciousness: awake and alert Pain management: pain level controlled Vital Signs Assessment: post-procedure vital signs reviewed and stable Respiratory status: spontaneous breathing, nonlabored ventilation, respiratory function stable and patient connected to nasal cannula oxygen Cardiovascular status: blood pressure returned to baseline and stable Postop Assessment: no apparent nausea or vomiting Anesthetic complications: no     Last Vitals:  Vitals:   03/23/19 1409 03/23/19 1454  BP: 120/83 126/84  Pulse: 86 87  Resp: 19 16  Temp:  (!) 36.2 C  SpO2: 99% 97%    Last Pain:  Vitals:   03/23/19 1454  TempSrc:   PainSc: 1                  Precious Haws Darvin Dials

## 2019-03-23 NOTE — Evaluation (Signed)
Physical Therapy Evaluation Patient Details Name: Calvin Burns MRN: [id number] DOB: 08/25/1960 Today's Date: 03/23/2019   History of Present Illness  pt is 59 yo male s/p R TKA 03/23/19. PMH includes L TKA 11/2018  Clinical Impression  Pt is a 59 yo male s/p R TKA seen for PT eval in PACU. Pt reporting minimal pain upon arrival and throughout session. Pt reports living with his wife with 4 steps to enter with bilateral hand rails and he is able to stay on main level of home. Pt recently had L TKA in September of 2020. Pt mod I to get EOB and min guard assist for transfers, ambulation and stairs. Pt required educated on HEP and performed most exercises this session with correct performance. Pt overall steady with RW during transfers, ambulation and stairs. Pt ambulated ~175 ft and up/down 4 steps with no buckling, unsteadiness or LOB. Pt presents with decreased R knee ROM -5-75 degrees. Pt presents with decreased strength, ROM, balance and activity tolerance consistent with recent surgery. Pt educated on polar care mgt, HEP and car transfers with pt verbalizing understanding. Pt reports no concerns over returning home and feels comfortable managing at home with his wife. All pts questions answered. Pt will benefit from continued acute PT to further maximize functional mobility to allow for return to PLOF. Recommendation for home health PT following hospital discharge.     Follow Up Recommendations Home health PT;Follow surgeon's recommendation for DC plan and follow-up therapies    Equipment Recommendations  None recommended by PT    Recommendations for Other Services       Precautions / Restrictions Precautions Precautions: Fall Restrictions Weight Bearing Restrictions: Yes RLE Weight Bearing: Weight bearing as tolerated      Mobility  Bed Mobility Overal bed mobility: Modified Independent             General bed mobility comments: mildly increased time to get EOB, overall  safe  Transfers Overall transfer level: Needs assistance Equipment used: Rolling walker (2 wheeled) Transfers: Sit to/from Stand Sit to Stand: Min guard         General transfer comment: min guard for safety, cuing for hand/foot placement, overall steady with transfer  Ambulation/Gait Ambulation/Gait assistance: Min guard Gait Distance (Feet): 175 Feet Assistive device: Rolling walker (2 wheeled) Gait Pattern/deviations: Step-to pattern;Decreased stride length;Decreased weight shift to right;Decreased stance time - right;Antalgic Gait velocity: mildly decreased   General Gait Details: pt ambulated to/from stairs in PACU with RW, overall steady with ambulation, no buckling or LOB noted, pt reports R knee feels slightly weaker but minimal pain  Stairs Stairs: Yes Stairs assistance: Min guard Stair Management: Two rails;Step to pattern Number of Stairs: 4 General stair comments: pt educated on stair navigation with up with the good down with the bad technique, pt overall steady with stairs utilizing B HR and step to pattern, no LOB or buckling noted  Wheelchair Mobility    Modified Rankin (Stroke Patients Only)       Balance Overall balance assessment: Mild deficits observed, not formally tested                                           Pertinent Vitals/Pain Pain Assessment: Faces Faces Pain Scale: Hurts a little bit Pain Location: R knee Pain Descriptors / Indicators: Sore;Aching Pain Intervention(s): Limited activity within patient's tolerance;Monitored during session;Repositioned;Ice applied  Home Living Family/patient expects to be discharged to:: Private residence Living Arrangements: Spouse/significant other Available Help at Discharge: Family;Available 24 hours/day Type of Home: House Home Access: Stairs to enter Entrance Stairs-Rails: Can reach both Entrance Stairs-Number of Steps: 4 Home Layout: Able to live on main level with  bedroom/bathroom;Multi-level Home Equipment: Shower seat;Hand held Tourist information centre manager - 2 wheels;Bedside commode      Prior Function Level of Independence: Independent         Comments: pt independent with ADLs, has been doing well since L TKA in september, per chart review his son lives with him and his wife     Hand Dominance   Dominant Hand: Right    Extremity/Trunk Assessment   Upper Extremity Assessment Upper Extremity Assessment: Overall WFL for tasks assessed    Lower Extremity Assessment Lower Extremity Assessment: Overall WFL for tasks assessed;RLE deficits/detail RLE Deficits / Details: RLE decreased strength, ROM, balance consistent with recent TKA    Cervical / Trunk Assessment Cervical / Trunk Assessment: Normal  Communication   Communication: No difficulties  Cognition Arousal/Alertness: Awake/alert Behavior During Therapy: WFL for tasks assessed/performed Overall Cognitive Status: Within Functional Limits for tasks assessed                                        General Comments      Exercises Total Joint Exercises Ankle Circles/Pumps: AROM;Both;10 reps Quad Sets: AROM;Right;10 reps Straight Leg Raises: AROM;Both;10 reps Long Arc Quad: AROM;Both;10 reps Goniometric ROM: R knee ROM -5-75 limited by bandaging Other Exercises Other Exercises: pt educated on HEP including AP, quad sets, SLR, hip abd, LAQ, and seated knee flexion. pt encouraged to continue working on R knee ROM primarily prior to seeing HHPT   Assessment/Plan    PT Assessment Patient needs continued PT services  PT Problem List Decreased strength;Decreased mobility;Decreased range of motion;Decreased activity tolerance;Decreased balance;Decreased knowledge of use of DME;Pain       PT Treatment Interventions DME instruction;Therapeutic exercise;Gait training;Balance training;Stair training;Neuromuscular re-education;Functional mobility training;Therapeutic  activities;Patient/family education    PT Goals (Current goals can be found in the Care Plan section)  Acute Rehab PT Goals Patient Stated Goal: return home PT Goal Formulation: With patient Time For Goal Achievement: 04/06/19 Potential to Achieve Goals: Good    Frequency BID   Barriers to discharge        Co-evaluation               AM-PAC PT "6 Clicks" Mobility  Outcome Measure Help needed turning from your back to your side while in a flat bed without using bedrails?: A Little Help needed moving from lying on your back to sitting on the side of a flat bed without using bedrails?: A Little Help needed moving to and from a bed to a chair (including a wheelchair)?: A Little Help needed standing up from a chair using your arms (e.g., wheelchair or bedside chair)?: A Little Help needed to walk in hospital room?: A Little Help needed climbing 3-5 steps with a railing? : A Little 6 Click Score: 18    End of Session Equipment Utilized During Treatment: Gait belt Activity Tolerance: Patient tolerated treatment well Patient left: in chair(in PACU, nursing present) Nurse Communication: Mobility status PT Visit Diagnosis: Difficulty in walking, not elsewhere classified (R26.2);Other abnormalities of gait and mobility (R26.89)    Time: [id number] PT Time Calculation (min) (ACUTE ONLY): 34  min   Charges:   PT Evaluation $PT Eval Moderate Complexity: 1 Mod PT Treatments $Therapeutic Activity: 23-37 mins        Zachary George PT, DPT 2:57 PM,03/23/19 [phone]   Alp Goldwater Drucilla Chalet 03/23/2019, 2:54 PM

## 2019-03-23 NOTE — Anesthesia Preprocedure Evaluation (Signed)
Anesthesia Evaluation  Patient identified by MRN, date of birth, ID band Patient awake    Reviewed: Allergy & Precautions, H&P , NPO status , Patient's Chart, lab work & pertinent test results  History of Anesthesia Complications (+) history of anesthetic complications  Airway Mallampati: II  TM Distance: >3 FB Neck ROM: full    Dental  (+) Chipped, Poor Dentition   Pulmonary neg shortness of breath, asthma , Patient abstained from smoking.,           Cardiovascular Exercise Tolerance: Good (-) angina(-) Past MI and (-) DOE negative cardio ROS       Neuro/Psych  Headaches, negative psych ROS   GI/Hepatic Neg liver ROS, GERD  Medicated and Controlled,  Endo/Other  negative endocrine ROS  Renal/GU      Musculoskeletal  (+) Arthritis ,   Abdominal   Peds  Hematology negative hematology ROS (+)   Anesthesia Other Findings Past Medical History: No date: Arthritis No date: Asthma No date: Complication of anesthesia     Comment:  Spinal headache that resulted in a blood patch. No date: Dysphagia No date: Environmental and seasonal allergies No date: GERD (gastroesophageal reflux disease) No date: Headache(784.0)     Comment:  History of migraines No date: Knee pain  Past Surgical History: No date: COLONOSCOPY     Comment:  X 2 01/10/2017: COLONOSCOPY; N/A     Comment:  Procedure: COLONOSCOPY;  Surgeon: Rogene Houston, MD;               [facility];  Service: Endoscopy;                Laterality: N/A;  9:55 No date: ESOPHAGOGASTRODUODENOSCOPY     Comment:  1 yr ago for dysphagia 01/10/2012: ESOPHAGOGASTRODUODENOSCOPY (EGD) WITH ESOPHAGEAL DILATION     Comment:  Procedure: ESOPHAGOGASTRODUODENOSCOPY (EGD) WITH               ESOPHAGEAL DILATION;  Surgeon: Rogene Houston, MD;                [facility]: [facility];  Service: Endoscopy;                Laterality: N/A;  930 No date: Hemorlroids    Comment:  14 yr ago. No date: HEMORRHOID SURGERY 11/12/2018: KNEE ARTHROPLASTY; Left     Comment:  Procedure: COMPUTER ASSISTED TOTAL KNEE ARTHROPLASTY;                Surgeon: Dereck Leep, MD;  Location: [facility];                Service: Orthopedics;  Laterality: Left; 01/10/2017: POLYPECTOMY     Comment:  Procedure: POLYPECTOMY;  Surgeon: Rogene Houston, MD;               [facility]: [facility];  Service: Endoscopy;;  colon No date: TONSILLECTOMY  BMI    Body Mass Index: 21.18 kg/m      Reproductive/Obstetrics negative OB ROS                             Anesthesia Physical Anesthesia Plan  ASA: III  Anesthesia Plan: Spinal   Post-op Pain Management:    Induction:   PONV Risk Score and Plan:   Airway Management Planned: Natural Airway and Nasal Cannula  Additional Equipment:   Intra-op Plan:   Post-operative Plan:   Informed Consent:  I have reviewed the patients History and Physical, chart, labs and discussed the procedure including the risks, benefits and alternatives for the proposed anesthesia with the patient or authorized representative who has indicated his/her understanding and acceptance.     Dental Advisory Given  Plan Discussed with: Anesthesiologist, CRNA and Surgeon  Anesthesia Plan Comments: (Patient reports no bleeding problems and no anticoagulant use.  Reports no problems with previous spinal.  Plan for spinal with backup GA  Patient consented for risks of anesthesia including but not limited to:  - adverse reactions to medications - risk of bleeding, infection, nerve damage and headache - risk of failed spinal - damage to teeth, lips or other oral mucosa - sore throat or hoarseness - Damage to heart, brain, lungs or loss of life  Patient voiced understanding.)        Anesthesia Quick Evaluation

## 2019-03-23 NOTE — Evaluation (Signed)
Occupational Therapy Evaluation Patient Details Name: Calvin Burns MRN: [id number] DOB: 05/31/1960 Today's Date: 03/23/2019    History of Present Illness pt is 59 yo male s/p R TKA 03/23/19. PMH includes L TKA 11/2018   Clinical Impression   Pt seen for OT evaluation this date, POD#0 from above surgery. Pt was independent in all ADL prior to surgery and using SPC for mobility since recent L TKA. Pt is eager to return to PLOF with less pain and improved safety and independence. Pt currently requires supervision to [place] assist for ADL transfers and LB dressing while in seated position due to pain and limited AROM of R knee. Pt instructed in polar care mgt, falls prevention strategies, home/routines modifications, pet care considerations, DME/AE for LB bathing and dressing tasks, and compression stocking mgt. Handout provided to support recall and carryover. Pt verbalizes understanding and recall from September L TKA and denies additional need for OT services. Do not currently anticipate any OT needs following this hospitalization. Will sign off.     Follow Up Recommendations  No OT follow up    Equipment Recommendations  None recommended by OT    Recommendations for Other Services       Precautions / Restrictions Precautions Precautions: Fall Restrictions Weight Bearing Restrictions: Yes RLE Weight Bearing: Weight bearing as tolerated      Mobility Bed Mobility Overal bed mobility: Modified Independent             General bed mobility comments: mildly increased time to get EOB, overall safe  Transfers Overall transfer level: Needs assistance Equipment used: Rolling walker (2 wheeled) Transfers: Sit to/from Stand Sit to Stand: Min guard         General transfer comment: min guard for safety, cuing for hand/foot placement, overall steady with transfer    Balance Overall balance assessment: Mild deficits observed, not formally tested                                         ADL either performed or assessed with clinical judgement   ADL Overall ADL's : Modified independent                                       General ADL Comments: supervision to CGA for ADL transfers and LB ADL     Vision Baseline Vision/History: No visual deficits Patient Visual Report: No change from baseline       Perception     Praxis      Pertinent Vitals/Pain Pain Assessment: 0-10 Pain Score: 1  Faces Pain Scale: Hurts a little bit Pain Location: R knee Pain Descriptors / Indicators: Sore;Aching Pain Intervention(s): Limited activity within patient's tolerance;Monitored during session;Repositioned;Ice applied     Hand Dominance Right   Extremity/Trunk Assessment Upper Extremity Assessment Upper Extremity Assessment: Overall WFL for tasks assessed   Lower Extremity Assessment Lower Extremity Assessment: Overall WFL for tasks assessed;RLE deficits/detail RLE Deficits / Details: RLE decreased strength, ROM, balance consistent with recent TKA   Cervical / Trunk Assessment Cervical / Trunk Assessment: Normal   Communication Communication Communication: No difficulties   Cognition Arousal/Alertness: Awake/alert Behavior During Therapy: WFL for tasks assessed/performed Overall Cognitive Status: Within Functional Limits for tasks assessed  General Comments       Exercises Total Joint Exercises Ankle Circles/Pumps: AROM;Both;10 reps Quad Sets: AROM;Right;10 reps Straight Leg Raises: AROM;Both;10 reps Long Arc Quad: AROM;Both;10 reps Goniometric ROM: R knee ROM -5-75 limited by bandaging Other Exercises Other Exercises: pt educated on HEP including AP, quad sets, SLR, hip abd, LAQ, and seated knee flexion. pt encouraged to continue working on R knee ROM primarily prior to seeing HHPT Other Exercises: Pt instructed in polar care mgt, compression stocking mgt, falls prevention  strategies, pet care considerations, and home/routines modifications to maximize safety/independence   Shoulder Instructions      Home Living Family/patient expects to be discharged to:: Private residence Living Arrangements: Spouse/significant other Available Help at Discharge: Family;Available 24 hours/day Type of Home: House Home Access: Stairs to enter [employer] of Steps: 4 Entrance Stairs-Rails: Can reach both Home Layout: Able to live on main level with bedroom/bathroom;Multi-level     Bathroom Shower/Tub: Occupational psychologist: Standard     Home Equipment: Shower seat;Hand held Tourist information centre manager - 2 wheels;Bedside commode          Prior Functioning/Environment Level of Independence: Independent        Comments: pt independent with ADLs, has been doing well since L TKA in september, per chart review his son lives with him and his wife        OT Problem List: Pain      OT Treatment/Interventions:      OT Goals(Current goals can be found in the care plan section) Acute Rehab OT Goals Patient Stated Goal: return home OT Goal Formulation: All assessment and education complete, DC therapy  OT Frequency:     Barriers to D/C:            Co-evaluation              AM-PAC OT "6 Clicks" Daily Activity     Outcome Measure Help from another person eating meals?: None Help from another person taking care of personal grooming?: None Help from another person toileting, which includes using toliet, bedpan, or urinal?: A Little Help from another person bathing (including washing, rinsing, drying)?: A Little Help from another person to put on and taking off regular upper body clothing?: None Help from another person to put on and taking off regular lower body clothing?: A Little 6 Click Score: 21   End of Session    Activity Tolerance: Patient tolerated treatment well Patient left: in chair;with call bell/phone within reach;with  nursing/sitter in room;Other (comment)(polar care in place)  OT Visit Diagnosis: Other abnormalities of gait and mobility (R26.89);Pain Pain - Right/Left: Right Pain - part of body: Knee                Time: [id number] OT Time Calculation (min): 10 min Charges:  OT General Charges $OT Visit: 1 Visit OT Evaluation $OT Eval Low Complexity: 1 Low  Jeni Salles, MPH, MS, OTR/L ascom [phone] 03/23/19, 3:28 PM

## 2019-08-03 ENCOUNTER — Other Ambulatory Visit: Payer: Self-pay | Admitting: *Deleted

## 2019-08-03 DIAGNOSIS — R350 Frequency of micturition: Secondary | ICD-10-CM

## 2019-08-04 ENCOUNTER — Ambulatory Visit (INDEPENDENT_AMBULATORY_CARE_PROVIDER_SITE_OTHER): Payer: BC Managed Care – PPO | Admitting: Urology

## 2019-08-04 ENCOUNTER — Encounter: Payer: Self-pay | Admitting: Urology

## 2019-08-04 ENCOUNTER — Other Ambulatory Visit: Payer: Self-pay

## 2019-08-04 ENCOUNTER — Other Ambulatory Visit
Admission: RE | Admit: 2019-08-04 | Discharge: 2019-08-04 | Disposition: A | Discharge status: Home / Self Care | Payer: BC Managed Care – PPO | Attending: Urology | Admitting: Urology

## 2019-08-04 VITALS — BP 141/98 | HR 78 | Ht 74.0 in | Wt 168.0 lb

## 2019-08-04 DIAGNOSIS — R350 Frequency of micturition: Secondary | ICD-10-CM | POA: Diagnosis not present

## 2019-08-04 DIAGNOSIS — Z125 Encounter for screening for malignant neoplasm of prostate: Secondary | ICD-10-CM | POA: Diagnosis not present

## 2019-08-04 LAB — BLADDER SCAN AMB NON-IMAGING

## 2019-08-04 LAB — URINALYSIS, COMPLETE (UACMP) WITH MICROSCOPIC
Bacteria, UA: NONE SEEN
Bilirubin Urine: NEGATIVE
Glucose, UA: NEGATIVE mg/dL
Ketones, ur: NEGATIVE mg/dL
Leukocytes,Ua: NEGATIVE
Nitrite: NEGATIVE
Protein, ur: NEGATIVE mg/dL
Specific Gravity, Urine: 1.015 (ref 1.005–1.030)
Squamous Epithelial / HPF: NONE SEEN (ref 0–5)
WBC, UA: NONE SEEN WBC/hpf (ref 0–5)
pH: 6 (ref 5.0–8.0)

## 2019-08-04 NOTE — Progress Notes (Signed)
08/04/19 11:07 AM   Calvin Burns [birth year] [id number]  CC: Urinary frequency, PSA screening, trace blood on dipstick  HPI: I saw Calvin Burns in urology clinic for evaluation of urinary frequency, PSA screening, and trace blood on urine dipstick.  He is a healthy 59 year old male with a strong family history of non-lethal prostate cancer in his father, uncle, and grandfather.  He has mildly bothersome urinary symptoms of occasional frequency during the day, and nocturia 1-2 times per night.  His nocturia is worse when he has a few beers before bed.  He was found to have trace blood on dipstick urinalysis x2, however there was no microscopic hematuria on these urine samples.  He denies any history of gross hematuria.  He is minimally bothered by his urinary symptoms.  PSA normal at 0.94, and stable from 0.83 in 2019.  He has a 2-pack-year smoking history of cigarettes, but does admit to marijuana use and occasional cocaine.  He previously worked as an Chief Financial Officer, and denies any other carcinogenic exposures.  PMH: Past Medical History:  Diagnosis Date  . Arthritis   . Asthma   . Complication of anesthesia    Spinal headache that resulted in a blood patch.  Marland Kitchen Dysphagia   . Environmental and seasonal allergies   . GERD (gastroesophageal reflux disease)   . Headache(784.0)    History of migraines  . Knee pain     Surgical History: Past Surgical History:  Procedure Laterality Date  . COLONOSCOPY     X 2  . COLONOSCOPY N/A 01/10/2017   Procedure: COLONOSCOPY;  Surgeon: Rogene Houston, MD;  [facility];  Service: Endoscopy;  Laterality: N/A;  9:55  . ESOPHAGOGASTRODUODENOSCOPY     1 yr ago for dysphagia  . ESOPHAGOGASTRODUODENOSCOPY (EGD) WITH ESOPHAGEAL DILATION  01/10/2012   Procedure: ESOPHAGOGASTRODUODENOSCOPY (EGD) WITH ESOPHAGEAL DILATION;  Surgeon: Rogene Houston, MD;  [facility];  Service: Endoscopy;  Laterality: N/A;  930  . Hemorlroids     14  yr ago.  Marland Kitchen HEMORRHOID SURGERY    . KNEE ARTHROPLASTY Left 11/12/2018   Procedure: COMPUTER ASSISTED TOTAL KNEE ARTHROPLASTY;  Surgeon: Dereck Leep, MD;  Location: [facility];  Service: Orthopedics;  Laterality: Left;  . KNEE ARTHROPLASTY Right 03/23/2019   Procedure: COMPUTER ASSISTED TOTAL KNEE ARTHROPLASTY;  Surgeon: Dereck Leep, MD;  Location: [facility];  Service: Orthopedics;  Laterality: Right;  . POLYPECTOMY  01/10/2017   Procedure: POLYPECTOMY;  Surgeon: Rogene Houston, MD;  [facility];  Service: Endoscopy;;  colon  . TONSILLECTOMY      Family History: Family History  Problem Relation Age of Onset  . Colon cancer Other     Social History:  reports that he has never smoked. He has never used smokeless tobacco. He reports current alcohol use of about 28.0 standard drinks of alcohol per week. He reports current drug use. Drugs: Marijuana and Cocaine.  Physical Exam: BP (!) 141/98   Pulse 78   Ht 6\' 2"  (1.88 m)   Wt 168 lb (76.2 kg)   BMI 21.57 kg/m    Constitutional:  Alert and oriented, No acute distress. Cardiovascular: No clubbing, cyanosis, or edema. Respiratory: Normal respiratory effort, no increased work of breathing. GI: Abdomen is soft, nontender, nondistended, no abdominal masses GU: Phallus with widely patent meatus, testicles 20 cc and descended bilaterally without masses  Laboratory Data: Reviewed  Pertinent Imaging: None to review  Assessment & Plan:  In summary, is a healthy 59 year old male with a family history of prostate cancer and mild urinary symptoms and nocturia likely secondary to alcohol use before bed.  We discussed behavioral strategies including minimizing fluids in the evening, decreasing alcohol use before bed, and double voiding prior to bed.  He does not have any history of microscopic hematuria that would warrant further evaluation with imaging or cystoscopy.  We discussed return precautions at length including worsening  urinary symptoms, gross hematuria, or new microscopic hematuria with greater than 3 RBCs per high-power field.  Finally, we reviewed PSA screening as well as the risks and benefits, and I encouraged him to continue PSA screening with his PCP every 1 to 2 years up until age 67.  Continue PSA screening with PCP Follow-up as needed  I spent 45 total minutes on the day of the encounter including pre-visit review of the medical record, face-to-face time with the patient, and post visit ordering of labs/imaging/tests.  Calvin Madrid, MD 08/04/2019  [facility] [address] [place], [place] [address] [phone]

## 2019-08-04 NOTE — Patient Instructions (Signed)
Prostate Cancer Screening  Prostate cancer screening is a test that is done to check for the presence of prostate cancer in men. The prostate gland is a walnut-sized gland that is located below the bladder and in front of the rectum in males. The function of the prostate is to add fluid to semen during ejaculation. Prostate cancer is the second most common type of cancer in men. Who should have prostate cancer screening?  Screening recommendations vary based on age and other risk factors. Screening is recommended if:  You are older than age 55. If you are age 55-69, talk with your health care provider about your need for screening and how often screening should be done. Because most prostate cancers are slow growing and will not cause death, screening is generally reserved in this age group for men who have a 10-15-year life expectancy.  You are younger than age 55, and you have these risk factors: ? Being a black male or a male of African descent. ? Having a father, brother, or uncle who has been diagnosed with prostate cancer. The risk is higher if your family member's cancer occurred at an early age. Screening is not recommended if:  You are younger than age 40.  You are between the ages of 40 and 54 and you have no risk factors.  You are 70 years of age or older. At this age, the risks that screening can cause are greater than the benefits that it may provide. If you are at high risk for prostate cancer, your health care provider may recommend that you have screenings more often or that you start screening at a younger age. How is screening for prostate cancer done? The recommended prostate cancer screening test is a blood test called the prostate-specific antigen (PSA) test. PSA is a protein that is made in the prostate. As you age, your prostate naturally produces more PSA. Abnormally high PSA levels may be caused by:  Prostate cancer.  An enlarged prostate that is not caused by cancer  (benign prostatic hyperplasia, BPH). This condition is very common in older men.  A prostate gland infection (prostatitis). Depending on the PSA results, you may need more tests, such as:  A physical exam to check the size of your prostate gland.  Blood and imaging tests.  A procedure to remove tissue samples from your prostate gland for testing (biopsy). What are the benefits of prostate cancer screening?  Screening can help to identify cancer at an early stage, before symptoms start and when the cancer can be treated more easily.  There is a small chance that screening may lower your risk of dying from prostate cancer. The chance is small because prostate cancer is a slow-growing cancer, and most men with prostate cancer die from a different cause. What are the risks of prostate cancer screening? The main risk of prostate cancer screening is diagnosing and treating prostate cancer that would never have caused any symptoms or problems. This is called overdiagnosisand overtreatment. PSA screening cannot tell you if your PSA is high due to cancer or a different cause. A prostate biopsy is the only procedure to diagnose prostate cancer. Even the results of a biopsy may not tell you if your cancer needs to be treated. Slow-growing prostate cancer may not need any treatment other than monitoring, so diagnosing and treating it may cause unnecessary stress or other side effects. A prostate biopsy may also cause:  Infection or fever.  A false negative. This is   a result that shows that you do not have prostate cancer when you actually do have prostate cancer. Questions to ask your health care provider  When should I start prostate cancer screening?  What is my risk for prostate cancer?  How often do I need screening?  What type of screening tests do I need?  How do I get my test results?  What do my results mean?  Do I need treatment? Where to find more information  The American Cancer  Society: www.cancer.org  American Urological Association: www.auanet.org Contact a health care provider if:  You have difficulty urinating.  You have pain when you urinate or ejaculate.  You have blood in your urine or semen.  You have pain in your back or in the area of your prostate. Summary  Prostate cancer is a common type of cancer in men. The prostate gland is located below the bladder and in front of the rectum. This gland adds fluid to semen during ejaculation.  Prostate cancer screening may identify cancer at an early stage, when the cancer can be treated more easily.  The prostate-specific antigen (PSA) test is the recommended screening test for prostate cancer.  Discuss the risks and benefits of prostate cancer screening with your health care provider. If you are age 70 or older, the risks that screening can cause are greater than the benefits that it may provide. This information is not intended to replace advice given to you by your health care provider. Make sure you discuss any questions you have with your health care provider. Document Revised: 10/09/2018 Document Reviewed: 10/09/2018 Elsevier Patient Education  2020 [employer]  

## 2020-01-20 ENCOUNTER — Other Ambulatory Visit (INDEPENDENT_AMBULATORY_CARE_PROVIDER_SITE_OTHER): Payer: Self-pay | Admitting: Internal Medicine

## 2020-01-20 DIAGNOSIS — K2100 Gastro-esophageal reflux disease with esophagitis, without bleeding: Secondary | ICD-10-CM

## 2020-12-03 IMAGING — MR MRI OF THE RIGHT KNEE WITHOUT CONTRAST
6 series · 40 of 40 positions shown · non-contrast
Comparison: None.

CLINICAL DATA: Chronic right knee pain.

EXAM:
MRI OF THE RIGHT KNEE WITHOUT CONTRAST
TECHNIQUE: Multiplanar, multisequence MR imaging of the knee was performed. No
intravenous contrast was administered.

[Series 11: T2 fat-sat · coronal · right · 4.0mm · 0.59mm/px · 6 of 29 slices shown (1 of 3)]
[im 1/29]
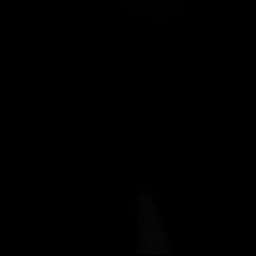
[im 6/29]
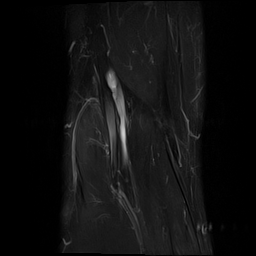
[im 12/29]
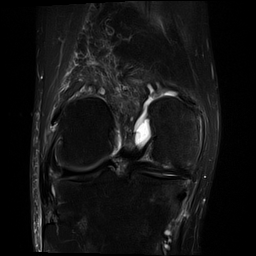
[im 17/29]
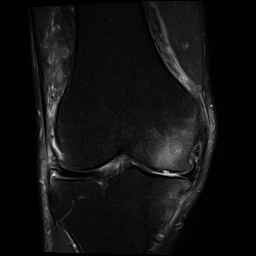
[im 23/29]
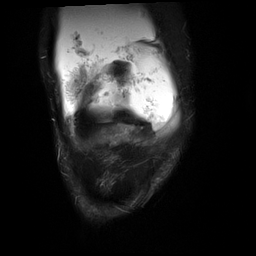
[im 29/29]
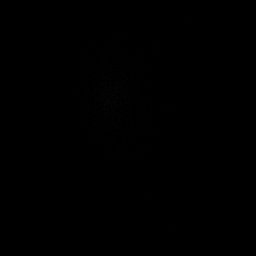

[Series 12: PD fat-sat · coronal · right · 4.0mm · 0.59mm/px · 6 of 30 slices shown (1 of 2)]
[im 1/30]
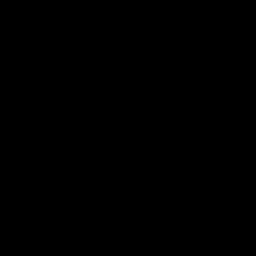
[im 6/30]
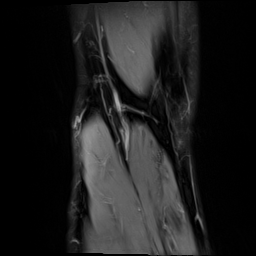
[im 12/30]
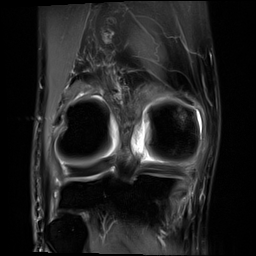
[im 18/30]
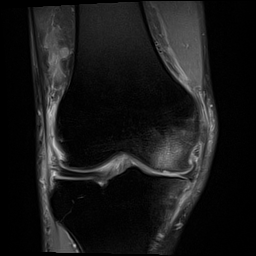
[im 24/30]
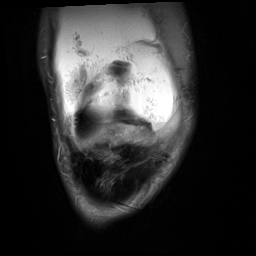
[im 30/30]
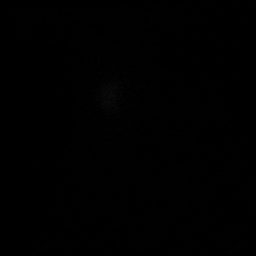

[Series 13: T1 · coronal · right · 4.0mm · 0.59mm/px · 6 of 30 slices shown]
[im 1/30]
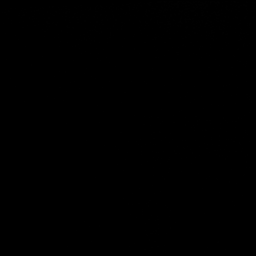
[im 6/30]
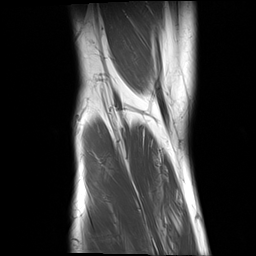
[im 12/30]
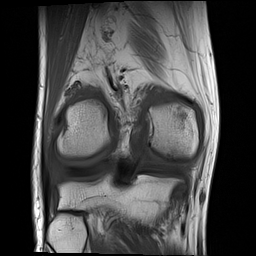
[im 18/30]
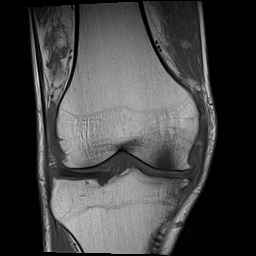
[im 24/30]
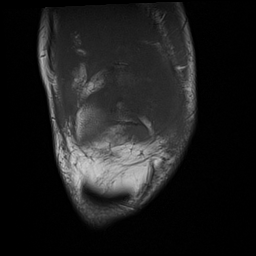
[im 30/30]
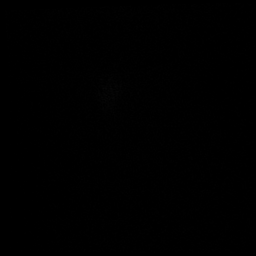

[Series 14: PD fat-sat · sagittal · right · 3.0mm · 0.59mm/px · 8 of 36 slices shown (2 of 2)]
[im 1/36]
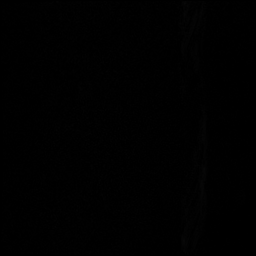
[im 6/36]
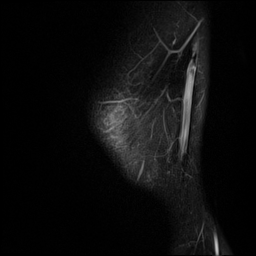
[im 11/36]
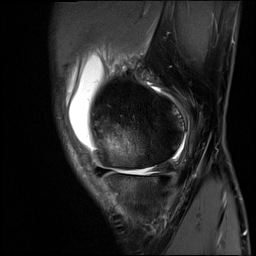
[im 16/36]
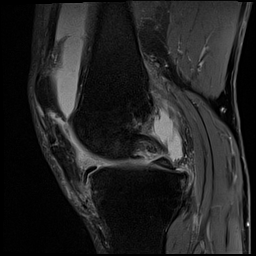
[im 21/36]
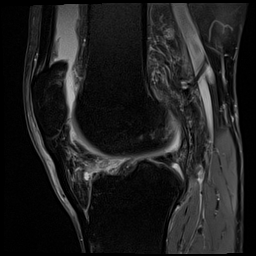
[im 26/36]
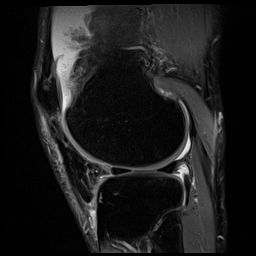
[im 31/36]
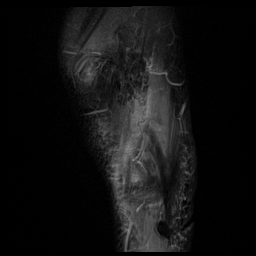
[im 36/36]
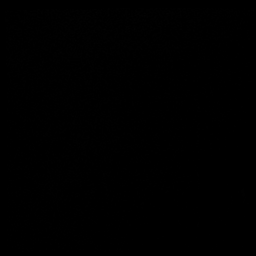

[Series 15: T2 fat-sat · sagittal · right · 3.0mm · 0.59mm/px · 8 of 36 slices shown (2 of 3)]
[im 1/36]
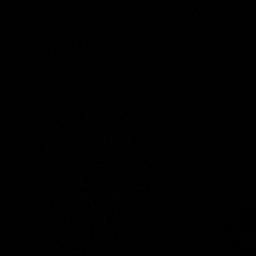
[im 6/36]
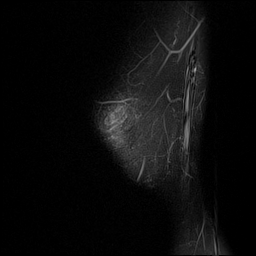
[im 11/36]
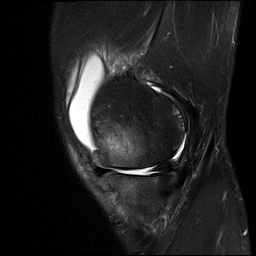
[im 16/36]
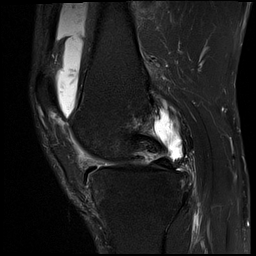
[im 21/36]
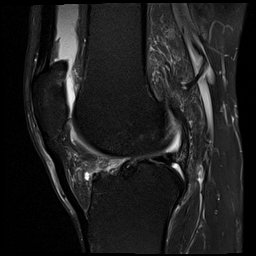
[im 26/36]
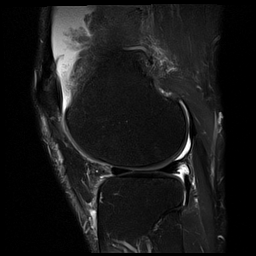
[im 31/36]
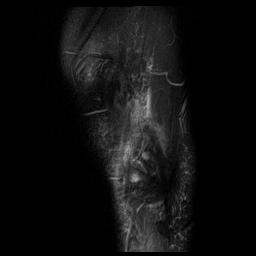
[im 36/36]
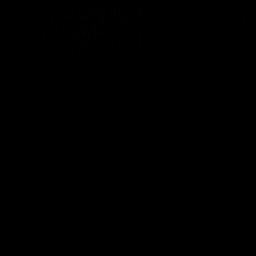

[Series 16: T2 fat-sat · axial · right · 4.0mm · 0.50mm/px · z∈[-41,+103]mm · 6 of 30 slices shown (3 of 3)]
[im 1/30]
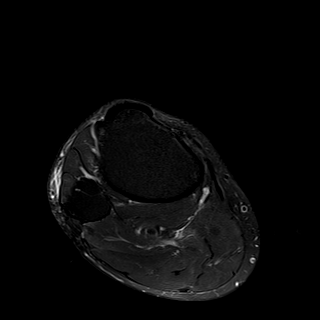
[im 6/30]
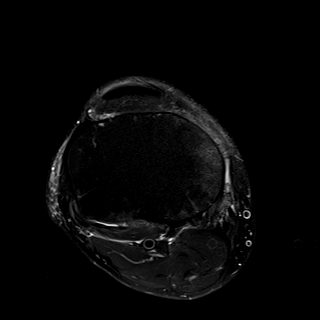
[im 12/30]
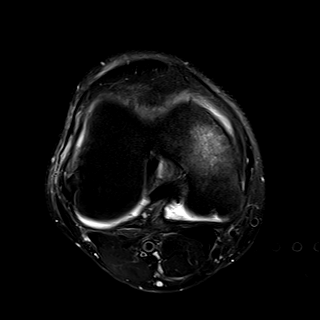
[im 18/30]
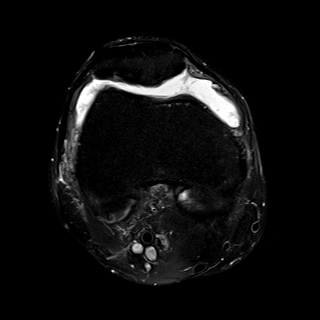
[im 24/30]
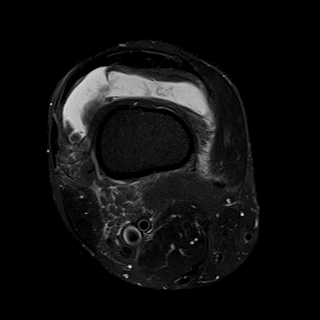
[im 30/30]
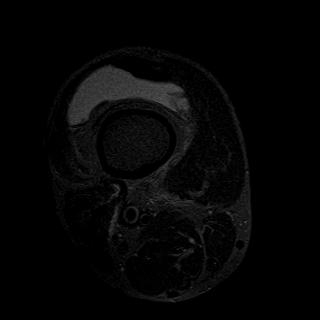

[40 of 40 positions shown; findings below may reference images not displayed]

FINDINGS: MENISCI

Medial meniscus: Radial tear of the mid body. Horizontal tear of the
posterior horn.

Lateral meniscus:  Intact.

LIGAMENTS

Cruciates:  Intact ACL and PCL.

Collaterals: Medial collateral ligament is intact. Lateral
collateral ligament complex is intact.

CARTILAGE

Patellofemoral: Scattered high-grade partial-thickness cartilage
loss over the patella with areas of full-thickness cartilage loss
over the apex. Scattered subchondral marrow edema.

Medial: Large areas of full-thickness cartilage loss over the medial
femoral condyle and peripheral medial tibial plateau with
subchondral marrow edema.

Lateral:  No chondral defect.

Joint: Large joint effusion with synovitis. Two 5-6 mm loose bodies
in the posterior joint space behind the PCL and inferior to the
medial gastrocnemius tendon origin. Mild edema within Hoffa's fat.
Mildly thickened suprapatellar plica.

Popliteal Fossa:  No Baker cyst. Intact popliteus tendon.

Extensor Mechanism: Intact quadriceps tendon and patellar tendon.
Intact medial and lateral patellar retinaculum. Intact MPFL.

Bones: No acute fracture or dislocation. No suspicious bone lesion.

Other: None.
IMPRESSION: 1. Radial tear of the medial meniscus midbody. Additional horizontal
tear of the posterior horn.
2. Advanced medial and patellofemoral compartment osteoarthritis.
3. Large joint effusion with synovitis and two small loose bodies.

## 2021-08-18 ENCOUNTER — Ambulatory Visit
Admission: EM | Admit: 2021-08-18 | Discharge: 2021-08-18 | Disposition: A | Discharge status: Home / Self Care | Payer: 59 | Attending: Internal Medicine | Admitting: Internal Medicine

## 2021-08-18 ENCOUNTER — Ambulatory Visit: Payer: Self-pay

## 2021-08-18 ENCOUNTER — Ambulatory Visit (INDEPENDENT_AMBULATORY_CARE_PROVIDER_SITE_OTHER): Payer: 59

## 2021-08-18 DIAGNOSIS — J4531 Mild persistent asthma with (acute) exacerbation: Secondary | ICD-10-CM

## 2021-08-18 DIAGNOSIS — R0789 Other chest pain: Secondary | ICD-10-CM

## 2021-08-18 MED ORDER — FLUTICASONE PROPIONATE 50 MCG/ACT NA SUSP: 1.0000 | Freq: Every day | NASAL | 0 refills | Status: AC

## 2021-08-18 MED ORDER — PREDNISONE 20 MG PO TABS: 20.0000 mg | ORAL_TABLET | Freq: Every day | ORAL | 0 refills | Status: AC

## 2021-08-18 MED ORDER — GUAIFENESIN ER 600 MG PO TB12: 600.0000 mg | ORAL_TABLET | Freq: Two times a day (BID) | ORAL | 0 refills | Status: AC

## 2021-08-18 MED ORDER — BENZONATATE 100 MG PO CAPS: 100.0000 mg | ORAL_CAPSULE | Freq: Three times a day (TID) | ORAL | 0 refills | Status: DC

## 2021-08-18 NOTE — ED Triage Notes (Signed)
Patient presents to UC for congestion, cough, and chest pain. Symptoms started about a week ago.  Patient has not tried anything OTC.

## 2021-08-18 NOTE — Discharge Instructions (Addendum)
EKG shows normal sinus rhythm Continue to use your inhaler as prescribed Humidifier vapor rub use will help with postnasal drainage Tessalon Perles as needed for cough Chest x-ray was negative for pneumonia Return to urgent care if you have worsening symptoms.

## 2021-08-18 NOTE — ED Provider Notes (Signed)
[facility]    CSN: [id number] Arrival date & time: 08/18/21  1417      History   Chief Complaint No chief complaint on file.   HPI Calvin Burns is a 61 y.o. male comes to the urgent care with 1 week history of cough productive of yellowish sputum, nasal congestion with postnasal drainage and left-sided chest discomfort.  Patient says symptoms started insidiously and has been persistent.  Patient has been using his inhaler with no improvement in symptoms.  No fever or chills.  No nausea or vomiting.  Patient denies any aggravating or relieving factors for the chest discomfort.  Pain is aggravated by taking a deep breath but denies any worsening pain with exertion or relieved by rest.  No radiation of the pain.  No trauma to the chest. HPI  Past Medical History:  Diagnosis Date   Arthritis    Asthma    Complication of anesthesia    Spinal headache that resulted in a blood patch.   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   GERD (gastroesophageal reflux disease) 11/11/2018   Mild intermittent asthma 11/11/2018   Primary osteoarthritis of right knee 08/14/2017   IGT (impaired glucose tolerance) 04/12/2017   Family hx of colon cancer 09/06/2016   Dysphagia 05/23/2011   Asthma 05/23/2011    Past Surgical History:  Procedure Laterality Date   COLONOSCOPY     X 2   COLONOSCOPY N/A 01/10/2017   Procedure: COLONOSCOPY;  Surgeon: Rogene Houston, MD;  Location: [facility];  Service: Endoscopy;  Laterality: N/A;  9:55   ESOPHAGOGASTRODUODENOSCOPY     1 yr ago for dysphagia   ESOPHAGOGASTRODUODENOSCOPY (EGD) WITH ESOPHAGEAL DILATION  01/10/2012   Procedure: ESOPHAGOGASTRODUODENOSCOPY (EGD) WITH ESOPHAGEAL DILATION;  Surgeon: Rogene Houston, MD;   Location: [facility];  Service: Endoscopy;  Laterality: N/A;  930   Hemorlroids     14 yr ago.   HEMORRHOID SURGERY     KNEE ARTHROPLASTY Left 11/12/2018   Procedure: COMPUTER ASSISTED TOTAL KNEE ARTHROPLASTY;  Surgeon: Dereck Leep, MD;  Location: [facility];  Service: Orthopedics;  Laterality: Left;   KNEE ARTHROPLASTY Right 03/23/2019   Procedure: COMPUTER ASSISTED TOTAL KNEE ARTHROPLASTY;  Surgeon: Dereck Leep, MD;  Location: [facility];  Service: Orthopedics;  Laterality: Right;   POLYPECTOMY  01/10/2017   Procedure: POLYPECTOMY;  Surgeon: Rogene Houston, MD;  Location: [facility];  Service: Endoscopy;;  colon   TONSILLECTOMY         Home Medications    Prior to Admission medications   Medication Sig Start Date End Date Taking? Authorizing Provider  albuterol (PROVENTIL HFA;VENTOLIN HFA) 108 (90 BASE) MCG/ACT inhaler Inhale 1-2 puffs into the lungs every 6 (six) hours as needed for shortness of breath. Shortness of Breath   Yes [provider]  benzonatate (TESSALON) 100 MG capsule Take 1 capsule (100 mg total) by mouth every 8 (eight) hours. 08/18/21  Yes Romyn Boswell, Myrene Galas, MD  fluticasone (FLONASE) 50 MCG/ACT nasal spray Place 1 spray into both nostrils daily. 08/18/21  Yes Zakyah Yanes, Myrene Galas, MD  Fluticasone-Salmeterol (ADVAIR) 250-50 MCG/DOSE AEPB Inhale 1 puff into the lungs every evening.    Yes [provider]  guaiFENesin (MUCINEX) 600 MG 12 hr tablet Take 1 tablet (600 mg total) by mouth 2 (two) times daily for 10 days. 08/18/21 08/28/21 Yes Bethel Gaglio, Myrene Galas, MD  pantoprazole (PROTONIX) 40 MG tablet Take 1 tablet (40 mg total) by mouth daily. Last seen 2018. Due for yearly refills. 01/20/20  Yes Laurine Blazer B, PA-C  Tetrahydrozoline HCl (VISINE OP) Place 1 drop into both eyes 3 (three) times daily as needed (dry eyes).    Yes [provider]    Family History Family History  Problem Relation Age of Onset   Colon cancer Other     Social  History Social History   Tobacco Use   Smoking status: Never   Smokeless tobacco: Never  Vaping Use   Vaping Use: Never used  Substance Use Topics   Alcohol use: Yes    Alcohol/week: 28.0 standard drinks of alcohol    Types: 28 Cans of beer per week    Comment: 3 beer a day   Drug use: Yes    Types: Marijuana, Cocaine    Comment: pt report no use of cocaine for 5 years     Allergies   Patient has no known allergies.   Review of Systems Review of Systems As per HPI  Physical Exam Triage Vital Signs ED Triage Vitals  Enc Vitals Group     BP 08/18/21 1431 (!) 143/96     Pulse Rate 08/18/21 1431 73     Resp 08/18/21 1431 20     Temp 08/18/21 1431 98.2 F (36.8 C)     Temp Source 08/18/21 1431 Oral     SpO2 08/18/21 1431 99 %     Weight 08/18/21 1430 170 lb (77.1 kg)     Height 08/18/21 1430 '6\' 2"'$  (1.88 m)     Head Circumference --      Peak Flow --      Pain Score 08/18/21 1429 2     Pain Loc --      Pain Edu? --      Excl. in [place]? --    No data found.  Updated Vital Signs BP (!) 143/96 (BP Location: Left Arm)   Pulse 73   Temp 98.2 F (36.8 C) (Oral)   Resp 20   Ht '6\' 2"'$  (1.88 m)   Wt 77.1 kg   SpO2 99%   BMI 21.83 kg/m   Visual Acuity Right Eye Distance:   Left Eye Distance:   Bilateral Distance:    Right Eye Near:   Left Eye Near:    Bilateral Near:     Physical Exam Vitals and nursing note reviewed.  Constitutional:      General: He is not in acute distress.    Appearance: He is not ill-appearing.  HENT:     Right Ear: Tympanic membrane normal.     Left Ear: Tympanic membrane normal.  Cardiovascular:     Rate and Rhythm: Normal rate and regular rhythm.     Pulses: Normal pulses.     Heart sounds: Normal heart sounds.  Pulmonary:     Effort: Pulmonary effort is normal.     Breath sounds: Normal breath sounds. No wheezing or rhonchi.  Abdominal:     General: Abdomen is flat. Bowel sounds are normal.  Neurological:     Mental Status:  He is alert.      UC Treatments / Results  Labs (all labs ordered are listed, but only abnormal results are displayed) Labs Reviewed -  No data to display  EKG   Radiology DG Chest 2 View  Result Date: 08/18/2021 CLINICAL DATA:  Chest discomfort. EXAM: CHEST - 2 VIEW COMPARISON:  None Available. FINDINGS: The lungs are clear without focal pneumonia, edema, pneumothorax or pleural effusion. Mild hyperexpansion noted. Tiny granuloma evident right lung base. The cardiopericardial silhouette is within normal limits for size. The visualized bony structures of the thorax are unremarkable. IMPRESSION: No active cardiopulmonary disease. Electronically Signed   By: Misty Stanley M.D.   On: 08/18/2021 15:49    Procedures Procedures (including critical care time)  Medications Ordered in UC Medications - No data to display  Initial Impression / Assessment and Plan / UC Course  I have reviewed the triage vital signs and the nursing notes.  Pertinent labs & imaging results that were available during my care of the patient were reviewed by me and considered in my medical decision making (see chart for details).     1.  Mild persistent asthma with acute exacerbation: Chest x-ray is negative for acute lung infiltrate EKG shows normal sinus rhythm Tessalon Perles as needed for cough Fluticasone nasal spray Mucinex twice daily Maintain adequate hydration Return to urgent care if you have worsening symptoms Short course of steroids recommended  Final Clinical Impressions(s) / UC Diagnoses   Final diagnoses:  Mild persistent asthma with (acute) exacerbation     Discharge Instructions      EKG shows normal sinus rhythm Continue to use your inhaler as prescribed Humidifier vapor rub use will help with postnasal drainage Tessalon Perles as needed for cough Chest x-ray was negative for pneumonia Return to urgent care if you have worsening symptoms.   ED Prescriptions     Medication  Sig Dispense Auth. Provider   benzonatate (TESSALON) 100 MG capsule Take 1 capsule (100 mg total) by mouth every 8 (eight) hours. 21 capsule Benard Minturn, Myrene Galas, MD   fluticasone (FLONASE) 50 MCG/ACT nasal spray Place 1 spray into both nostrils daily. 16 g Chase Picket, MD   guaiFENesin (MUCINEX) 600 MG 12 hr tablet Take 1 tablet (600 mg total) by mouth 2 (two) times daily for 10 days. 20 tablet Valyn Latchford, Myrene Galas, MD      PDMP not reviewed this encounter.   Chase Picket, MD 08/18/21 1600

## 2021-11-21 ENCOUNTER — Ambulatory Visit
Admission: RE | Admit: 2021-11-21 | Discharge: 2021-11-21 | Disposition: A | Discharge status: Home / Self Care | Payer: BC Managed Care – PPO | Source: Ambulatory Visit | Attending: Family Medicine | Admitting: Family Medicine

## 2021-11-21 ENCOUNTER — Ambulatory Visit (INDEPENDENT_AMBULATORY_CARE_PROVIDER_SITE_OTHER): Payer: BC Managed Care – PPO

## 2021-11-21 VITALS — BP 149/90 | HR 74 | Temp 98.0°F | Resp 18 | Ht 74.0 in | Wt 170.0 lb

## 2021-11-21 DIAGNOSIS — J40 Bronchitis, not specified as acute or chronic: Secondary | ICD-10-CM | POA: Diagnosis not present

## 2021-11-21 DIAGNOSIS — R062 Wheezing: Secondary | ICD-10-CM | POA: Diagnosis not present

## 2021-11-21 DIAGNOSIS — J4531 Mild persistent asthma with (acute) exacerbation: Secondary | ICD-10-CM | POA: Diagnosis not present

## 2021-11-21 DIAGNOSIS — R059 Cough, unspecified: Secondary | ICD-10-CM | POA: Diagnosis not present

## 2021-11-21 MED ORDER — FLUTICASONE-SALMETEROL 250-50 MCG/DOSE IN AEPB: 1.0000 | INHALATION_SPRAY | Freq: Every evening | RESPIRATORY_TRACT | 0 refills | Status: AC

## 2021-11-21 MED ORDER — PROMETHAZINE-DM 6.25-15 MG/5ML PO SYRP
5.0000 mL | ORAL_SOLUTION | Freq: Four times a day (QID) | ORAL | 0 refills | Status: DC | PRN
Start: 2021-11-21 — End: 2022-05-20

## 2021-11-21 MED ORDER — PREDNISONE 20 MG PO TABS: 40.0000 mg | ORAL_TABLET | Freq: Every day | ORAL | 0 refills | Status: AC

## 2021-11-21 MED ORDER — AZITHROMYCIN 250 MG PO TABS
ORAL_TABLET | ORAL | 0 refills | Status: AC
Start: 2021-11-21 — End: 2021-11-26

## 2021-11-21 MED ORDER — ALBUTEROL SULFATE HFA 108 (90 BASE) MCG/ACT IN AERS: 1.0000 | INHALATION_SPRAY | Freq: Four times a day (QID) | RESPIRATORY_TRACT | 1 refills | Status: DC | PRN

## 2021-11-21 NOTE — ED Provider Notes (Signed)
[facility]    CSN: [id number] Arrival date & time: 11/21/21  0954      History   Chief Complaint Chief Complaint  Patient presents with   Cough   Nasal Congestion    HPI Calvin Burns is a 61 y.o. male.   HPI   Calvin Burns presents for ongoing cough for the past 2 weeks.   Cough is productive and he has intermittent chest tightness.  He coughs throughout the day but it is worse at night.  He has sinus pressure, nasal congestion and headache.  He has not had a fever.  No ear pain or sore throat.  Denies abdominal pain, nausea, vomiting.  He has been eating and drinking well. Endorses some loose nonbloody stools.  He has history of asthma and finds himself wheezing especially at night.  He has history of asthma and finds himself wheezing especially at night.   Past Medical History:  Diagnosis Date   Arthritis    Asthma    Complication of anesthesia    Spinal headache that resulted in a blood patch.   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   GERD (gastroesophageal reflux disease) 11/11/2018   Mild intermittent asthma 11/11/2018   Primary osteoarthritis of right knee 08/14/2017   IGT (impaired glucose tolerance) 04/12/2017   Family hx of colon cancer 09/06/2016   Dysphagia 05/23/2011   Asthma 05/23/2011    Past Surgical History:  Procedure Laterality Date   COLONOSCOPY     X 2   COLONOSCOPY N/A 01/10/2017   Procedure: COLONOSCOPY;  Surgeon: Rogene Houston, MD;  [facility];  Service: Endoscopy;  Laterality: N/A;  9:55   ESOPHAGOGASTRODUODENOSCOPY     1 yr ago for dysphagia   ESOPHAGOGASTRODUODENOSCOPY (EGD) WITH ESOPHAGEAL DILATION  01/10/2012   Procedure: ESOPHAGOGASTRODUODENOSCOPY (EGD) WITH ESOPHAGEAL DILATION;  Surgeon:  Rogene Houston, MD;  [facility]: [facility];  Service: Endoscopy;  Laterality: N/A;  930   Hemorlroids     14 yr ago.   HEMORRHOID SURGERY     KNEE ARTHROPLASTY Left 11/12/2018   Procedure: COMPUTER ASSISTED TOTAL KNEE ARTHROPLASTY;  Surgeon: Dereck Leep, MD;  Location: [facility];  Service: Orthopedics;  Laterality: Left;   KNEE ARTHROPLASTY Right 03/23/2019   Procedure: COMPUTER ASSISTED TOTAL KNEE ARTHROPLASTY;  Surgeon: Dereck Leep, MD;  Location: [facility];  Service: Orthopedics;  Laterality: Right;   POLYPECTOMY  01/10/2017   Procedure: POLYPECTOMY;  Surgeon: Rogene Houston, MD;  [facility]: [facility];  Service: Endoscopy;;  colon   TONSILLECTOMY         Home Medications    Prior to Admission medications   Medication Sig Start Date End Date Taking? Authorizing Provider  azithromycin (ZITHROMAX Z-PAK) 250 MG tablet Take 2 tablets (500 mg total) by mouth daily for 1 day, THEN 1 tablet (250 mg total) daily for 4 days. 11/21/21 11/26/21 Yes Emmily Pellegrin, DO  pantoprazole (PROTONIX) 40 MG tablet Take 1 tablet (40 mg total) by mouth daily. Last seen 2018. Due for yearly refills. 01/20/20  Yes Laurine Blazer B, PA-C  predniSONE (DELTASONE) 20 MG tablet Take 2 tablets (40 mg total) by mouth daily for 5 days. 11/21/21 11/26/21 Yes Ammaar Encina, Ronnette Juniper,  DO  promethazine-dextromethorphan (PROMETHAZINE-DM) 6.25-15 MG/5ML syrup Take 5 mLs by mouth 4 (four) times daily as needed for cough. 11/21/21  Yes Jasmene Goswami, DO  propranolol (INDERAL) 10 MG tablet Take 1 tablet by mouth 2 (two) times daily. 10/30/21  Yes [provider]  albuterol (VENTOLIN HFA) 108 (90 Base) MCG/ACT inhaler Inhale 1-2 puffs into the lungs every 6 (six) hours as needed for shortness of breath. Shortness of Breath 11/21/21   Delany Steury, Ronnette Juniper, DO  fluticasone (FLONASE) 50 MCG/ACT nasal spray Place 1 spray into both nostrils daily. 08/18/21   Chase Picket, MD  Fluticasone-Salmeterol (ADVAIR) 250-50 MCG/DOSE AEPB  Inhale 1 puff into the lungs every evening. 11/21/21   Lyndee Hensen, DO  Tetrahydrozoline HCl (VISINE OP) Place 1 drop into both eyes 3 (three) times daily as needed (dry eyes).     [provider]    Family History Family History  Problem Relation Age of Onset   Colon cancer Other     Social History Social History   Tobacco Use   Smoking status: Never   Smokeless tobacco: Never  Vaping Use   Vaping Use: Never used  Substance Use Topics   Alcohol use: Yes    Alcohol/week: 28.0 standard drinks of alcohol    Types: 28 Cans of beer per week    Comment: 3 beer a day   Drug use: Yes    Types: Marijuana, Cocaine    Comment: pt report no use of cocaine for 5 years     Allergies   Patient has no known allergies.   Review of Systems Review of Systems: negative unless otherwise stated in HPI.      Physical Exam Triage Vital Signs ED Triage Vitals  Enc Vitals Group     BP 11/21/21 1034 (!) 149/90     Pulse Rate 11/21/21 1034 74     Resp 11/21/21 1034 18     Temp 11/21/21 1034 98 F (36.7 C)     Temp Source 11/21/21 1034 Oral     SpO2 11/21/21 1034 100 %     Weight 11/21/21 1032 170 lb (77.1 kg)     Height 11/21/21 1032 '6\' 2"'$  (1.88 m)     Head Circumference --      Peak Flow --      Pain Score 11/21/21 1032 0     Pain Loc --      Pain Edu? --      Excl. in [place]? --    No data found.  Updated Vital Signs BP (!) 149/90 (BP Location: Left Arm)   Pulse 74   Temp 98 F (36.7 C) (Oral)   Resp 18   Ht '6\' 2"'$  (1.88 m)   Wt 77.1 kg   SpO2 100%   BMI 21.83 kg/m   Visual Acuity Right Eye Distance:   Left Eye Distance:   Bilateral Distance:    Right Eye Near:   Left Eye Near:    Bilateral Near:     Physical Exam GEN:     alert, non-ill appearing male in no distress    HENT:  mucus membranes moist, oropharyngeal without lesions or exudate, no tonsillar hypertrophy,  mild oropharyngeal erythema ,  moderate erythematous hypertrophied turbinates, clear  nasal discharge, bilateral TM normal EYES:   pupils equal and reactive, EOMi, no scleral injection NECK:  normal ROM RESP:  no increased work of breathing, mild expiratory wheezing in the left upper lung, no rales, no rhonchi CVS:  regular rate and rhythm Skin:   warm and dry    UC Treatments / Results  Labs (all labs ordered are listed, but only abnormal results are displayed) Labs Reviewed - No data to display  EKG   Radiology DG Chest 2 View  Result Date: 11/21/2021 CLINICAL DATA:  Cough, wheezing EXAM: CHEST - 2 VIEW COMPARISON:  08/18/2021 FINDINGS: Cardiac size is within normal limits. Increase in AP diameter of chest may be a normal variation due to patient's body habitus or suggest COPD. There are no signs of pulmonary edema or focal pulmonary consolidation. There is no pleural effusion or pneumothorax. IMPRESSION: No active cardiopulmonary disease. Electronically Signed   By: Elmer Picker M.D.   On: 11/21/2021 10:20    Procedures Procedures (including critical care time)  Medications Ordered in UC Medications - No data to display  Initial Impression / Assessment and Plan / UC Course  I have reviewed the triage vital signs and the nursing notes.  Pertinent labs & imaging results that were available during my care of the patient were reviewed by me and considered in my medical decision making (see chart for details).       Pt is a 61 y.o. male with history of asthma who presents for 2 weeks days of respiratory symptoms. Emoni is afebrile here without recent antipyretics. Satting well on room air. Overall pt is well appearing, well hydrated, without respiratory distress.  He has some mild expiratory wheezing likely exacerbated by recent viral illness.  Chest x-ray obtained, personally reviewed by me, without pneumothorax, focal pneumonia, pleural effusions or body megaly.  Radiology suggest possible COPD.  Patient denies any history of smoking.  He has an  exacerbation of his asthma.  Treat with steroids and antibiotics for presumed bacterial bronchitis.  Refilled patient's Advair and albuterol inhaler.  Work note offered but declined. Discussed return and ED precautions, understanding voiced.   Discussed MDM, treatment plan and plan for follow-up with patient/parent who agrees with plan.     Final Clinical Impressions(s) / UC Diagnoses   Final diagnoses:  Mild persistent asthma with acute exacerbation  Bronchitis     Discharge Instructions      Stop by the pharmacy to pick up your prescriptions.  Follow up with your primary care provider as needed.      ED Prescriptions     Medication Sig Dispense Auth. Provider   albuterol (VENTOLIN HFA) 108 (90 Base) MCG/ACT inhaler Inhale 1-2 puffs into the lungs every 6 (six) hours as needed for shortness of breath. Shortness of Breath 6.7 g Sangeeta Youse, DO   Fluticasone-Salmeterol (ADVAIR) 250-50 MCG/DOSE AEPB Inhale 1 puff into the lungs every evening. 1 each Joneric Streight, DO   predniSONE (DELTASONE) 20 MG tablet Take 2 tablets (40 mg total) by mouth daily for 5 days. 10 tablet Makaylyn Sinyard, DO   azithromycin (ZITHROMAX Z-PAK) 250 MG tablet Take 2 tablets (500 mg total) by mouth daily for 1 day, THEN 1 tablet (250 mg total) daily for 4 days. 6 tablet Dwyne Hasegawa, DO   promethazine-dextromethorphan (PROMETHAZINE-DM) 6.25-15 MG/5ML syrup Take 5 mLs by mouth 4 (four) times daily as needed for cough. 118 mL Lyndee Hensen, DO      PDMP not reviewed this encounter.   Lyndee Hensen, DO 11/21/21 1548

## 2021-11-21 NOTE — Discharge Instructions (Addendum)
Stop by the pharmacy to pick up your prescriptions.  Follow up with your primary care provider as needed.  

## 2021-11-21 NOTE — ED Triage Notes (Signed)
Pt c/o congestion in head and chest, yellow phlegm x2weeks.

## 2022-05-08 ENCOUNTER — Telehealth: Payer: Self-pay | Admitting: *Deleted

## 2022-05-08 NOTE — Telephone Encounter (Signed)
Called pt and it went to voicemail- gave him directions, told about mask requirement , free valet, 1 visitor can come if they are 16 yrs or older. He is coming for hematology and not cancer. Look forward to meeting him tom 10:45

## 2022-05-09 ENCOUNTER — Encounter: Payer: Self-pay | Admitting: Oncology

## 2022-05-09 ENCOUNTER — Inpatient Hospital Stay: Payer: BC Managed Care – PPO

## 2022-05-09 ENCOUNTER — Inpatient Hospital Stay: Payer: BC Managed Care – PPO | Attending: Oncology | Admitting: Oncology

## 2022-05-09 VITALS — BP 142/95 | HR 66 | Temp 97.2°F | Ht 73.0 in | Wt 172.2 lb

## 2022-05-09 DIAGNOSIS — D649 Anemia, unspecified: Secondary | ICD-10-CM | POA: Insufficient documentation

## 2022-05-09 DIAGNOSIS — R718 Other abnormality of red blood cells: Secondary | ICD-10-CM

## 2022-05-09 LAB — CBC WITH DIFFERENTIAL/PLATELET
Abs Immature Granulocytes: 0.03 10*3/uL (ref 0.00–0.07)
Basophils Absolute: 0.1 10*3/uL (ref 0.0–0.1)
Basophils Relative: 1 %
Eosinophils Absolute: 0.4 10*3/uL (ref 0.0–0.5)
Eosinophils Relative: 7 %
HCT: 46.4 % (ref 39.0–52.0)
Hemoglobin: 15.1 g/dL (ref 13.0–17.0)
Immature Granulocytes: 1 %
Lymphocytes Relative: 27 %
Lymphs Abs: 1.7 10*3/uL (ref 0.7–4.0)
MCH: 23.2 pg — ABNORMAL LOW (ref 26.0–34.0)
MCHC: 32.5 g/dL (ref 30.0–36.0)
MCV: 71.3 fL — ABNORMAL LOW (ref 80.0–100.0)
Monocytes Absolute: 0.9 10*3/uL (ref 0.1–1.0)
Monocytes Relative: 15 %
Neutro Abs: 3.1 10*3/uL (ref 1.7–7.7)
Neutrophils Relative %: 49 %
Platelets: 255 10*3/uL (ref 150–400)
RBC: 6.51 MIL/uL — ABNORMAL HIGH (ref 4.22–5.81)
RDW: 16.4 % — ABNORMAL HIGH (ref 11.5–15.5)
WBC: 6.1 10*3/uL (ref 4.0–10.5)
nRBC: 0 % (ref 0.0–0.2)

## 2022-05-09 LAB — IRON AND TIBC
Iron: 109 ug/dL (ref 45–182)
Saturation Ratios: 30 % (ref 17.9–39.5)
TIBC: 361 ug/dL (ref 250–450)
UIBC: 252 ug/dL

## 2022-05-09 LAB — FERRITIN: Ferritin: 36 ng/mL (ref 24–336)

## 2022-05-09 LAB — VITAMIN B12: Vitamin B-12: 350 pg/mL (ref 180–914)

## 2022-05-09 NOTE — Progress Notes (Signed)
Patient states that he is here to get opinion on blood condition that he has.

## 2022-05-09 NOTE — Progress Notes (Signed)
Hematology/Oncology Consult note [facility] [phone] [phone]  Patient Care Team: Sallee Lange, NP as PCP - General (Internal Medicine)   Name of the patient: Calvin Burns  ET:[id number]  08/25/1960    Reason for referral-microcytosis without anemia   Referring physician-Sarah Gauger, NP  Date of visit: 05/09/22   History of presenting illness- Patient is a 62 year old male with a past medical history significant for GERD asthma referred for anemia.  Most recent CBC from 04/26/2022 showed H&H of 14.8/45.7 with a white count of 6.6 and a platelet count of 304.  MCV was low at 72.9.  TSH normal.  Iron studies a year ago were normal with a TIBC of 345 and iron saturation of 54%.  Ferritin levels were low normal at 37.  Patient has European ancestry from [place] and [place].  He is not aware of any family history of anemia.  He is doing well and denies any complaints at this time.  ECOG PS- 0  Pain scale- 0   Review of systems- Review of Systems  Constitutional:  Negative for chills, fever, malaise/fatigue and weight loss.  HENT:  Negative for congestion, ear discharge and nosebleeds.   Eyes:  Negative for blurred vision.  Respiratory:  Negative for cough, hemoptysis, sputum production, shortness of breath and wheezing.   Cardiovascular:  Negative for chest pain, palpitations, orthopnea and claudication.  Gastrointestinal:  Negative for abdominal pain, blood in stool, constipation, diarrhea, heartburn, melena, nausea and vomiting.  Genitourinary:  Negative for dysuria, flank pain, frequency, hematuria and urgency.  Musculoskeletal:  Negative for back pain, joint pain and myalgias.  Skin:  Negative for rash.  Neurological:  Negative for dizziness, tingling, focal weakness, seizures, weakness and headaches.  Endo/Heme/Allergies:  Does not bruise/bleed easily.  Psychiatric/Behavioral:  Negative for depression and suicidal ideas.  The patient does not have insomnia.     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   Dysphagia    Environmental and seasonal allergies    GERD (gastroesophageal reflux disease)    Headache(784.0)    History of migraines   Knee pain      Past Surgical History:  Procedure Laterality Date   COLONOSCOPY     X 2   COLONOSCOPY N/A 01/10/2017   Procedure: COLONOSCOPY;  Surgeon: Rogene Houston, MD;  [facility];  Service: Endoscopy;  Laterality: N/A;  9:55   ESOPHAGOGASTRODUODENOSCOPY     1 yr ago for dysphagia   ESOPHAGOGASTRODUODENOSCOPY (EGD) WITH ESOPHAGEAL DILATION  01/10/2012   Procedure: ESOPHAGOGASTRODUODENOSCOPY (EGD) WITH ESOPHAGEAL DILATION;  Surgeon: Rogene Houston, MD;  [facility];  Service: Endoscopy;  Laterality: N/A;  930   Hemorlroids     14 yr ago.   HEMORRHOID SURGERY     KNEE ARTHROPLASTY Left 11/12/2018   Procedure: COMPUTER ASSISTED TOTAL KNEE ARTHROPLASTY;  Surgeon: Dereck Leep, MD;  Location: [facility];  Service: Orthopedics;  Laterality: Left;  KNEE ARTHROPLASTY Right 03/23/2019   Procedure: COMPUTER ASSISTED TOTAL KNEE ARTHROPLASTY;  Surgeon: Dereck Leep, MD;  Location: [facility];  Service: Orthopedics;  Laterality: Right;   POLYPECTOMY  01/10/2017   Procedure: POLYPECTOMY;  Surgeon: Rogene Houston, MD;  [facility];  Service: Endoscopy;;  colon   TONSILLECTOMY      Social History   Socioeconomic History    Marital status: Married    Spouse name: Not on file   Number of children: Not on file   Years of education: Not on file   Highest education level: Not on file  Occupational History   Not on file  Tobacco Use   Smoking status: Never   Smokeless tobacco: Never  Vaping Use   Vaping Use: Never used  Substance and Sexual Activity   Alcohol use: Yes    Alcohol/week: 28.0 standard drinks of alcohol    Types: 28 Cans of beer per week    Comment: 3 beer a day   Drug use: Yes    Types: Marijuana, Cocaine    Comment: pt report no use of cocaine for 5 years   Sexual activity: Yes  Other Topics Concern   Not on file  Social History Narrative   Not on file   Social Determinants of Health   Financial Resource Strain: Not on file  Food Insecurity: Not on file  Transportation Needs: Not on file  Physical Activity: Not on file  Stress: Not on file  Social Connections: Not on file  Intimate Partner Violence: Not on file     Family History  Problem Relation Age of Onset   Colon cancer Other      Current Outpatient Medications:    albuterol (VENTOLIN HFA) 108 (90 Base) MCG/ACT inhaler, Inhale 1-2 puffs into the lungs every 6 (six) hours as needed for shortness of breath. Shortness of Breath, Disp: 6.7 g, Rfl: 1   fluticasone (FLONASE) 50 MCG/ACT nasal spray, Place 1 spray into both nostrils daily., Disp: 16 g, Rfl: 0   Fluticasone-Salmeterol (ADVAIR) 250-50 MCG/DOSE AEPB, Inhale 1 puff into the lungs every evening., Disp: 1 each, Rfl: 0   pantoprazole (PROTONIX) 40 MG tablet, Take 1 tablet (40 mg total) by mouth daily. Last seen 2018. Due for yearly refills., Disp: 90 tablet, Rfl: 0   promethazine-dextromethorphan (PROMETHAZINE-DM) 6.25-15 MG/5ML syrup, Take 5 mLs by mouth 4 (four) times daily as needed for cough., Disp: 118 mL, Rfl: 0   propranolol (INDERAL) 10 MG tablet, Take 1 tablet by mouth 2 (two) times daily., Disp: , Rfl:    Tetrahydrozoline HCl (VISINE OP), Place 1 drop into  both eyes 3 (three) times daily as needed (dry eyes). , Disp: , Rfl:    valACYclovir (VALTREX) 1000 MG tablet, Take by mouth., Disp: , Rfl:    Physical exam:  Vitals:   05/09/22 1048 05/09/22 1058  BP: (!) 147/102 (!) 142/95  Pulse: 66   Temp: (!) 97.2 F (36.2 C)   TempSrc: Tympanic   SpO2: 98%   Weight: 172 lb 3.2 oz (78.1 kg)   Height: '6\' 1"'$  (1.854 m)    Physical Exam Cardiovascular:     Rate and Rhythm: Normal rate and regular rhythm.     Heart sounds: Normal heart sounds.  Pulmonary:     Effort: Pulmonary effort is normal.     Breath sounds: Normal breath sounds.  Abdominal:     General: Bowel sounds are normal.     Palpations: Abdomen is  soft.     Comments: No palpable hepatosplenomegaly.  Lymphadenopathy:     Comments: No palpable cervical, supraclavicular, axillary or inguinal adenopathy    Skin:    General: Skin is warm and dry.  Neurological:     Mental Status: He is alert and oriented to person, place, and time.           Latest Ref Rng & Units 02/16/2019    9:47 AM  CMP  Glucose 70 - 99 mg/dL 74   BUN 6 - 20 mg/dL 13   Creatinine 0.61 - 1.24 mg/dL 0.86   Sodium 135 - 145 mmol/L 135   Potassium 3.5 - 5.1 mmol/L 3.8   Chloride 98 - 111 mmol/L 102   CO2 22 - 32 mmol/L 26   Calcium 8.9 - 10.3 mg/dL 8.7   Total Protein 6.5 - 8.1 g/dL 6.6   Total Bilirubin 0.3 - 1.2 mg/dL 0.7   Alkaline Phos 38 - 126 U/L 75   AST 15 - 41 U/L 16   ALT 0 - 44 U/L 15       Latest Ref Rng & Units 02/16/2019    9:47 AM  CBC  WBC 4.0 - 10.5 K/uL 6.0   Hemoglobin 13.0 - 17.0 g/dL 14.6   Hematocrit 39.0 - 52.0 % 45.4   Platelets 150 - 400 K/uL 313     Assessment and plan- Patient is a 62 y.o. male referred for microcytosis without anemia  Patient has mild microcytosis with an MCV ranging around 74 without overt anemia.  Hemoglobin has remained stable between 14-15.  RBC volume has been mildly high.  I doubt that we are dealing with iron deficiency although his iron  numbers have not been checked for a year now.  Will check CBC ferritin and iron studies hemoglobin electrophoresis today.  Discussed that thalassemia is a possibility and explained what thalassemia means.  Given that he is overall asymptomatic with no anemia even he were to be diagnosed with thalassemia it would not change his management presently.  I will see him back in about 4 weeks to discuss the results of his blood work   Thank you for this kind referral and the opportunity to participate in the care of this patient   Visit Diagnosis 1. Microcytosis     Dr. Randa Evens, MD, MPH [facility] [id number] 05/09/2022

## 2022-05-14 LAB — HGB FRACTIONATION CASCADE
Hgb A2: 3.8 % — ABNORMAL HIGH (ref 1.8–3.2)
Hgb A: 96.2 % — ABNORMAL LOW (ref 96.4–98.8)
Hgb F: 0 % (ref 0.0–2.0)
Hgb S: 0 %

## 2022-05-20 ENCOUNTER — Ambulatory Visit
Admission: EM | Admit: 2022-05-20 | Discharge: 2022-05-20 | Disposition: A | Discharge status: Home / Self Care | Payer: BC Managed Care – PPO | Attending: Emergency Medicine | Admitting: Emergency Medicine

## 2022-05-20 DIAGNOSIS — R051 Acute cough: Secondary | ICD-10-CM

## 2022-05-20 DIAGNOSIS — J069 Acute upper respiratory infection, unspecified: Secondary | ICD-10-CM | POA: Diagnosis not present

## 2022-05-20 MED ORDER — BENZONATATE 100 MG PO CAPS: 200.0000 mg | ORAL_CAPSULE | Freq: Three times a day (TID) | ORAL | 0 refills | Status: DC

## 2022-05-20 MED ORDER — AMOXICILLIN-POT CLAVULANATE 875-125 MG PO TABS: 1.0000 | ORAL_TABLET | Freq: Two times a day (BID) | ORAL | 0 refills | Status: AC

## 2022-05-20 MED ORDER — PROMETHAZINE-DM 6.25-15 MG/5ML PO SYRP: 5.0000 mL | ORAL_SOLUTION | Freq: Four times a day (QID) | ORAL | 0 refills | Status: DC | PRN

## 2022-05-20 MED ORDER — IPRATROPIUM BROMIDE 0.06 % NA SOLN: 2.0000 | Freq: Four times a day (QID) | NASAL | 12 refills | Status: DC

## 2022-05-20 NOTE — ED Triage Notes (Signed)
Pt c/o nasal congestion & cough x7 days. Denies any fever,chills or bodyaches.

## 2022-05-20 NOTE — Discharge Instructions (Signed)
The Augmentin twice daily with food for 10 days for treatment of your URI.  Perform sinus irrigation 2-3 times a day with a [employer] sinus rinse kit and distilled water.  Do not use tap water.  You can use plain over-the-counter Mucinex every 6 hours to break up the stickiness of the mucus so your body can clear it.  Increase your oral fluid intake to thin out your mucus so that is also able for your body to clear more easily.  Take an over-the-counter probiotic, such as Culturelle-align-activia, 1 hour after each dose of antibiotic to prevent diarrhea.  Use the Atrovent nasal spray, 2 squirts in each nostril every 6 hours, as needed for runny nose and postnasal drip.  Use the Tessalon Perles every 8 hours during the day.  Take them with a small sip of water.  They may give you some numbness to the base of your tongue or a metallic taste in your mouth, this is normal.  Use the Promethazine DM cough syrup at bedtime for cough and congestion.  It will make you drowsy so do not take it during the day.  Use your Albuterol inhaler every 4-6 hours as needed for shortness of breath and wheezing.   If you develop any new or worsening symptoms return for reevaluation or see your primary care provider.

## 2022-05-20 NOTE — ED Provider Notes (Signed)
[facility]    CSN: [id number] Arrival date & time: 05/20/22  1351      History   Chief Complaint Chief Complaint  Patient presents with   Nasal Congestion        Cough    HPI Calvin Burns is a 62 y.o. male.   HPI  62 year old male here for evaluation of respiratory complaints.  The patient has a past medical history that is significant for asthma, GERD, and arthritis presenting for evaluation of 7 days of nasal congestion with yellow nasal discharge, sinus pressure, cough that is productive for yellow sputum, shortness of breath, and wheezing.  He denies any fever, ear pain, sore throat, or GI symptoms.  Past Medical History:  Diagnosis Date   Arthritis    Asthma    Complication of anesthesia    Spinal headache that resulted in a blood patch.   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   GERD (gastroesophageal reflux disease) 11/11/2018   Mild intermittent asthma 11/11/2018   Primary osteoarthritis of right knee 08/14/2017   IGT (impaired glucose tolerance) 04/12/2017   Family hx of colon cancer 09/06/2016   Dysphagia 05/23/2011   Asthma 05/23/2011    Past Surgical History:  Procedure Laterality Date   COLONOSCOPY     X 2   COLONOSCOPY N/A 01/10/2017   Procedure: COLONOSCOPY;  Surgeon: Rogene Houston, MD;  [facility];  Service: Endoscopy;  Laterality: N/A;  9:55   ESOPHAGOGASTRODUODENOSCOPY     1 yr ago for dysphagia   ESOPHAGOGASTRODUODENOSCOPY (EGD) WITH ESOPHAGEAL DILATION  01/10/2012   Procedure: ESOPHAGOGASTRODUODENOSCOPY (EGD) WITH ESOPHAGEAL DILATION;  Surgeon: Rogene Houston, MD;  [facility]: [facility];  Service: Endoscopy;  Laterality: N/A;  930   Hemorlroids     14 yr ago.   HEMORRHOID SURGERY      KNEE ARTHROPLASTY Left 11/12/2018   Procedure: COMPUTER ASSISTED TOTAL KNEE ARTHROPLASTY;  Surgeon: Dereck Leep, MD;  Location: [facility];  Service: Orthopedics;  Laterality: Left;   KNEE ARTHROPLASTY Right 03/23/2019   Procedure: COMPUTER ASSISTED TOTAL KNEE ARTHROPLASTY;  Surgeon: Dereck Leep, MD;  Location: [facility];  Service: Orthopedics;  Laterality: Right;   POLYPECTOMY  01/10/2017   Procedure: POLYPECTOMY;  Surgeon: Rogene Houston, MD;  [facility]: [facility];  Service: Endoscopy;;  colon   TONSILLECTOMY         Home Medications    Prior to Admission medications   Medication Sig Start Date End Date Taking? Authorizing Provider  albuterol (VENTOLIN HFA) 108 (90 Base) MCG/ACT inhaler Inhale 1-2 puffs into the lungs every 6 (six) hours as needed for shortness of breath. Shortness of Breath 11/21/21  Yes Brimage, Vondra, DO  amoxicillin-clavulanate (AUGMENTIN) 875-125 MG tablet Take 1 tablet by mouth every 12 (twelve) hours for 10 days. 05/20/22 05/30/22 Yes Margarette Canada, NP  benzonatate (TESSALON) 100 MG capsule Take 2 capsules (200 mg total) by mouth every 8 (eight) hours. 05/20/22  Yes Margarette Canada, NP  fluticasone (FLONASE) 50 MCG/ACT nasal spray Place 1 spray into both nostrils daily. 08/18/21  Yes Lamptey, Myrene Galas, MD  Fluticasone-Salmeterol (ADVAIR) 250-50 MCG/DOSE AEPB Inhale 1 puff into the lungs every evening. 11/21/21  Yes Lyndee Hensen,  DO  ipratropium (ATROVENT) 0.06 % nasal spray Place 2 sprays into both nostrils 4 (four) times daily. 05/20/22  Yes Margarette Canada, NP  pantoprazole (PROTONIX) 40 MG tablet Take 1 tablet (40 mg total) by mouth daily. Last seen 2018. Due for yearly refills. 01/20/20  Yes Ezzard Standing, PA-C  promethazine-dextromethorphan (PROMETHAZINE-DM) 6.25-15 MG/5ML syrup Take 5 mLs by mouth 4 (four) times daily as needed. 05/20/22  Yes Margarette Canada, NP  propranolol (INDERAL) 10 MG tablet Take 1 tablet by mouth 2 (two) times daily. 10/30/21  Yes [provider]  Tetrahydrozoline HCl (VISINE OP) Place 1 drop into both eyes 3 (three) times daily as needed (dry eyes).    Yes [provider]  valACYclovir (VALTREX) 1000 MG tablet Take by mouth. 04/19/22  Yes [provider]    Family History Family History  Problem Relation Age of Onset   Colon cancer Other     Social History Social History   Tobacco Use   Smoking status: Never   Smokeless tobacco: Never  Vaping Use   Vaping Use: Never used  Substance Use Topics   Alcohol use: Yes    Alcohol/week: 28.0 standard drinks of alcohol    Types: 28 Cans of beer per week    Comment: 3 beer a day   Drug use: Yes    Types: Marijuana, Cocaine    Comment: pt report no use of cocaine for 5 years     Allergies   Patient has no known allergies.   Review of Systems Review of Systems  Constitutional:  Negative for fever.  HENT:  Positive for congestion, rhinorrhea and sinus pressure. Negative for ear pain and sore throat.   Respiratory:  Positive for cough, shortness of breath and wheezing.   Gastrointestinal:  Negative for diarrhea, nausea and vomiting.     Physical Exam Triage Vital Signs ED Triage Vitals  Enc Vitals Group     BP 05/20/22 1359 (!) 136/92     Pulse Rate 05/20/22 1359 64     Resp 05/20/22 1359 16     Temp 05/20/22 1359 98.3 F (36.8 C)     Temp Source 05/20/22 1359 Oral     SpO2 05/20/22 1359 95 %     Weight 05/20/22 1358 170 lb (77.1 kg)     Height 05/20/22 1358 '6\' 2"'$  (1.88 m)     Head Circumference --      Peak Flow --      Pain Score 05/20/22 1358 0     Pain Loc --      Pain Edu? --      Excl. in [place]? --    No data found.  Updated Vital Signs BP (!) 136/92 (BP Location: Left Arm)   Pulse 64   Temp 98.3 F (36.8 C) (Oral)   Resp 16   Ht '6\' 2"'$  (1.88 m)   Wt 170 lb (77.1 kg)   SpO2 95%   BMI 21.83 kg/m   Visual Acuity Right Eye Distance:   Left Eye Distance:   Bilateral Distance:    Right Eye Near:   Left Eye Near:     Bilateral Near:     Physical Exam Vitals and nursing note reviewed.  Constitutional:      Appearance: Normal appearance. He is not ill-appearing.  HENT:     Head: Normocephalic and atraumatic.     Right Ear: Tympanic membrane, ear canal and external ear normal. There is no impacted cerumen.  Left Ear: Tympanic membrane, ear canal and external ear normal. There is no impacted cerumen.     Nose: Congestion and rhinorrhea present.     Comments: Nasal mucosa is erythematous and edematous with yellow discharge in both nares.  Patient has tenderness to bilateral maxillary sinuses.    Mouth/Throat:     Mouth: Mucous membranes are moist.     Pharynx: Oropharynx is clear. Posterior oropharyngeal erythema present. No oropharyngeal exudate.     Comments: Posterior pharynx is erythematous and injected with yellow postnasal drip. Cardiovascular:     Rate and Rhythm: Normal rate and regular rhythm.     Pulses: Normal pulses.     Heart sounds: Normal heart sounds. No murmur heard.    No friction rub. No gallop.  Pulmonary:     Effort: Pulmonary effort is normal.     Breath sounds: Normal breath sounds. No wheezing, rhonchi or rales.  Musculoskeletal:     Cervical back: Normal range of motion and neck supple.  Lymphadenopathy:     Cervical: No cervical adenopathy.  Skin:    General: Skin is warm and dry.     Capillary Refill: Capillary refill takes less than 2 seconds.     Findings: No erythema or rash.  Neurological:     General: No focal deficit present.     Mental Status: He is alert and oriented to person, place, and time.  Psychiatric:        Mood and Affect: Mood normal.        Behavior: Behavior normal.        Thought Content: Thought content normal.        Judgment: Judgment normal.      UC Treatments / Results  Labs (all labs ordered are listed, but only abnormal results are displayed) Labs Reviewed - No data to display  EKG   Radiology No results  found.  Procedures Procedures (including critical care time)  Medications Ordered in UC Medications - No data to display  Initial Impression / Assessment and Plan / UC Course  I have reviewed the triage vital signs and the nursing notes.  Pertinent labs & imaging results that were available during my care of the patient were reviewed by me and considered in my medical decision making (see chart for details).   Patient is a pleasant, nontoxic-appearing 62 year old male presenting for evaluation of 1 weeks worth of respiratory symptoms as outlined in HPI above.  He does have a history of mild intermittent asthma and has been using his albuterol inhaler for shortness of breath and wheezing.  He is not having any dyspnea or tachypnea and is able to speak in full sentences while answering questions during the history.  His respiratory rate is 16 and his room air oxygen saturation is 95%.  He is afebrile at 98.3.  His physical exam does reveal inflamed nasal mucosa with yellow nasal discharge.  The patient exam is consistent with an upper respiratory infection on believe the drainage is what is triggering his cough.  Given that he has had symptoms for 7 days I feel a trial of antibiotics is warranted and I will discharge him home on Augmentin 875 twice daily for 10 days.  I have also prescribed Atrovent nasal spray, Tessalon Perles, Promethazine DM cough syrup to help with cough and congestion.  He should continue to use his albuterol inhaler as needed for shortness of breath or wheezing.  Return precautions reviewed.   Final Clinical Impressions(s) / UC  Diagnoses   Final diagnoses:  Upper respiratory tract infection, unspecified type  Acute cough     Discharge Instructions      The Augmentin twice daily with food for 10 days for treatment of your URI.  Perform sinus irrigation 2-3 times a day with a NeilMed sinus rinse kit and distilled water.  Do not use tap water.  You can use plain  over-the-counter Mucinex every 6 hours to break up the stickiness of the mucus so your body can clear it.  Increase your oral fluid intake to thin out your mucus so that is also able for your body to clear more easily.  Take an over-the-counter probiotic, such as Culturelle-align-activia, 1 hour after each dose of antibiotic to prevent diarrhea.  Use the Atrovent nasal spray, 2 squirts in each nostril every 6 hours, as needed for runny nose and postnasal drip.  Use the Tessalon Perles every 8 hours during the day.  Take them with a small sip of water.  They may give you some numbness to the base of your tongue or a metallic taste in your mouth, this is normal.  Use the Promethazine DM cough syrup at bedtime for cough and congestion.  It will make you drowsy so do not take it during the day.  Use your Albuterol inhaler every 4-6 hours as needed for shortness of breath and wheezing.   If you develop any new or worsening symptoms return for reevaluation or see your primary care provider.      ED Prescriptions     Medication Sig Dispense Auth. Provider   amoxicillin-clavulanate (AUGMENTIN) 875-125 MG tablet Take 1 tablet by mouth every 12 (twelve) hours for 10 days. 20 tablet Margarette Canada, NP   benzonatate (TESSALON) 100 MG capsule Take 2 capsules (200 mg total) by mouth every 8 (eight) hours. 21 capsule Margarette Canada, NP   ipratropium (ATROVENT) 0.06 % nasal spray Place 2 sprays into both nostrils 4 (four) times daily. 15 mL Margarette Canada, NP   promethazine-dextromethorphan (PROMETHAZINE-DM) 6.25-15 MG/5ML syrup Take 5 mLs by mouth 4 (four) times daily as needed. 118 mL Margarette Canada, NP      PDMP not reviewed this encounter.   Margarette Canada, NP 05/20/22 1424

## 2022-06-04 ENCOUNTER — Inpatient Hospital Stay: Payer: BC Managed Care – PPO | Attending: Oncology | Admitting: Oncology

## 2022-06-04 ENCOUNTER — Encounter: Payer: Self-pay | Admitting: Oncology

## 2022-06-04 DIAGNOSIS — D563 Thalassemia minor: Secondary | ICD-10-CM | POA: Diagnosis not present

## 2022-06-04 NOTE — Progress Notes (Signed)
Patient does not have any concerns. 

## 2022-06-04 NOTE — Progress Notes (Signed)
I connected with Calvin Burns on 06/04/22 at  2:45 PM EDT by video enabled telemedicine visit and verified that I am speaking with the correct person using two identifiers.   I discussed the limitations, risks, security and privacy concerns of performing an evaluation and management service by telemedicine and the availability of in-person appointments. I also discussed with the patient that there may be a patient responsible charge related to this service. The patient expressed understanding and agreed to proceed.  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  Most recent CBC from 04/26/2022 showed H&H of 14.8/45.7 with a white count of 6.6 and a platelet count of 304.  MCV was low at 72.9.  TSH normal.  Iron studies a year ago were normal with a TIBC of 345 and iron saturation of 54%.  Ferritin levels were low normal at 37.   Patient has European ancestry from [place] and [place].  He is not aware of any family history of anemia.  Results of workup for microcytosis  revealed evidence of beta thalassemia minor and hemoglobin fractionation.  Ferritin levels were normal at 36 with iron saturation of 30% and normal iron studies.  B12 levels normal at 350.   Interval history patient is doing well presently and denies any complaints at this time   Review of Systems  Constitutional:  Negative for chills, fever, malaise/fatigue and weight loss.  HENT:  Negative for congestion, ear discharge  and nosebleeds.   Eyes:  Negative for blurred vision.  Respiratory:  Negative for cough, hemoptysis, sputum production, shortness of breath and wheezing.   Cardiovascular:  Negative for chest pain, palpitations, orthopnea and claudication.  Gastrointestinal:  Negative for abdominal pain, blood in stool, constipation, diarrhea, heartburn, melena, nausea and vomiting.  Genitourinary:  Negative for dysuria, flank pain, frequency, hematuria and urgency.  Musculoskeletal:  Negative for back pain, joint pain and myalgias.  Skin:  Negative for rash.  Neurological:  Negative for dizziness, tingling, focal weakness, seizures, weakness and headaches.  Endo/Heme/Allergies:  Does not bruise/bleed easily.  Psychiatric/Behavioral:  Negative for depression and suicidal ideas. The patient does not have insomnia.     No Known Allergies  Past Medical History:  Diagnosis Date   Arthritis    Asthma    Complication of anesthesia    Spinal headache that resulted in a blood patch.   Dysphagia    Environmental and seasonal allergies    GERD (gastroesophageal reflux disease)    Headache(784.0)    History of migraines   Knee pain     Past Surgical History:  Procedure Laterality Date   COLONOSCOPY     X 2   COLONOSCOPY N/A 01/10/2017   Procedure: COLONOSCOPY;  Surgeon: Rogene Houston, MD;  [facility];  Service: Endoscopy;  Laterality: N/A;  9:55   ESOPHAGOGASTRODUODENOSCOPY     1 yr ago for dysphagia   ESOPHAGOGASTRODUODENOSCOPY (EGD) WITH ESOPHAGEAL DILATION  01/10/2012   Procedure: ESOPHAGOGASTRODUODENOSCOPY (EGD) WITH ESOPHAGEAL DILATION;  Surgeon: Rogene Houston, MD;  [facility];  Service: Endoscopy;  Laterality: N/A;  930   Hemorlroids     14 yr ago.   HEMORRHOID SURGERY     KNEE ARTHROPLASTY Left 11/12/2018   Procedure: COMPUTER ASSISTED TOTAL KNEE ARTHROPLASTY;  Surgeon: Dereck Leep, MD;  Location: [facility];  Service: Orthopedics;  Laterality: Left;   KNEE  ARTHROPLASTY Right 03/23/2019   Procedure: COMPUTER ASSISTED TOTAL KNEE ARTHROPLASTY;  Surgeon: Dereck Leep, MD;  Location: [facility];  Service: Orthopedics;  Laterality: Right;   POLYPECTOMY  01/10/2017   Procedure: POLYPECTOMY;  Surgeon: Rogene Houston, MD;  [facility];  Service: Endoscopy;;  colon   TONSILLECTOMY      Social History   Socioeconomic History   Marital status: Married    Spouse name: Not on file   Number of children: Not on file   Years of education: Not on file   Highest education level: Not on file  Occupational History   Not on file  Tobacco Use   Smoking status: Never   Smokeless tobacco: Never  Vaping Use   Vaping Use: Never used  Substance and Sexual Activity   Alcohol use: Yes    Alcohol/week: 28.0 standard drinks of alcohol    Types: 28 Cans of beer per week    Comment: 3 beer a day   Drug use: Yes    Types: Marijuana, Cocaine    Comment: pt report no use of cocaine for 5 years   Sexual activity: Yes  Other Topics Concern   Not on file  Social History Narrative   Not on file   Social Determinants of Health   Financial Resource Strain: Not on file  Food Insecurity: Not on file  Transportation Needs: Not on file  Physical Activity: Not on file  Stress: Not on file  Social Connections: Not on file  Intimate Partner Violence: Not on file    Family History  Problem Relation Age of Onset   Colon cancer Other      Current Outpatient Medications:    albuterol (VENTOLIN HFA) 108 (90 Base) MCG/ACT inhaler, Inhale 1-2 puffs into the lungs every 6 (six) hours as needed for shortness of breath. Shortness of Breath, Disp: 6.7 g, Rfl: 1   benzonatate (TESSALON) 100 MG capsule, Take 2 capsules (200 mg total) by mouth every 8 (eight) hours., Disp: 21 capsule, Rfl: 0   fluticasone (FLONASE) 50 MCG/ACT nasal spray, Place 1 spray into both nostrils daily., Disp: 16 g, Rfl: 0   Fluticasone-Salmeterol (ADVAIR) 250-50 MCG/DOSE AEPB, Inhale 1  puff into the lungs every evening., Disp: 1 each, Rfl: 0   ipratropium (ATROVENT) 0.06 % nasal spray, Place 2 sprays into both nostrils 4 (four) times daily., Disp: 15 mL, Rfl: 12   pantoprazole (PROTONIX) 40 MG tablet, Take 1 tablet (40 mg total) by mouth daily. Last seen 2018. Due for yearly refills., Disp: 90 tablet, Rfl: 0   promethazine-dextromethorphan (PROMETHAZINE-DM) 6.25-15 MG/5ML syrup, Take 5 mLs by mouth 4 (four) times daily as needed., Disp: 118 mL, Rfl: 0   propranolol (INDERAL) 10 MG tablet, Take 1 tablet by mouth 2 (two) times daily., Disp: , Rfl:    Tetrahydrozoline HCl (VISINE OP), Place 1 drop into both eyes 3 (three) times daily as needed (dry eyes). , Disp: , Rfl:    valACYclovir (VALTREX) 1000 MG tablet, Take by mouth., Disp: , Rfl:   No results found.  No images are attached to the encounter.      Latest Ref  Rng & Units 02/16/2019    9:47 AM  CMP  Glucose 70 - 99 mg/dL 74   BUN 6 - 20 mg/dL 13   Creatinine 0.61 - 1.24 mg/dL 0.86   Sodium 135 - 145 mmol/L 135   Potassium 3.5 - 5.1 mmol/L 3.8   Chloride 98 - 111 mmol/L 102   CO2 22 - 32 mmol/L 26   Calcium 8.9 - 10.3 mg/dL 8.7   Total Protein 6.5 - 8.1 g/dL 6.6   Total Bilirubin 0.3 - 1.2 mg/dL 0.7   Alkaline Phos 38 - 126 U/L 75   AST 15 - 41 U/L 16   ALT 0 - 44 U/L 15       Latest Ref Rng & Units 05/09/2022   11:38 AM  CBC  WBC 4.0 - 10.5 K/uL 6.1   Hemoglobin 13.0 - 17.0 g/dL 15.1   Hematocrit 39.0 - 52.0 % 46.4   Platelets 150 - 400 K/uL 255      Observation/objective: Appears in no acute distress over video visit today.  Breathing is nonlabored  Assessment and plan: Patient is a 62 year old male referred for microcytosis without anemia likely secondary to beta thalassemia minor  Workup for microcytosis reveals normal iron studies.  Therefore iron deficiency is not a reason for his microcytosis.  Patient is not anemic and his hemoglobin has been stable between 14-15.  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  Discussed what thalassemia is and the fact that he is a beta thalassemia minor with a normal hemoglobin means he is not really affected by this.  He will have lifelong microcytosis which does not require any treatment and certainly does not require any iron as long as his iron levels are normal.  Follow-up instructions: Patient does not require any follow-up with me at this time  I discussed the assessment and treatment plan with the patient. The patient was provided an opportunity to ask questions and all were answered. The patient agreed with the plan and demonstrated an understanding of the instructions.   The patient was advised to call back or seek an in-person evaluation if the symptoms worsen or if the condition fails to improve as anticipated.  I provided 12 minutes of face-to-face video visit time during this encounter, and > 50% was spent counseling as documented under my assessment & plan.  Visit Diagnosis: 1. Beta thalassemia minor     Dr. Randa Evens, MD, MPH [facility] Tel- [phone] 06/04/2022 3:49 PM

## 2022-08-26 ENCOUNTER — Ambulatory Visit
Admission: RE | Admit: 2022-08-26 | Discharge: 2022-08-26 | Disposition: A | Discharge status: Home / Self Care | Payer: BC Managed Care – PPO | Source: Ambulatory Visit | Attending: Emergency Medicine | Admitting: Emergency Medicine

## 2022-08-26 ENCOUNTER — Other Ambulatory Visit: Payer: Self-pay

## 2022-08-26 VITALS — BP 145/89 | HR 74 | Temp 99.0°F | Resp 18

## 2022-08-26 DIAGNOSIS — J0100 Acute maxillary sinusitis, unspecified: Secondary | ICD-10-CM | POA: Diagnosis not present

## 2022-08-26 DIAGNOSIS — H1032 Unspecified acute conjunctivitis, left eye: Secondary | ICD-10-CM

## 2022-08-26 MED ORDER — IPRATROPIUM BROMIDE 0.06 % NA SOLN: 2.0000 | Freq: Four times a day (QID) | NASAL | 12 refills | Status: DC

## 2022-08-26 MED ORDER — MOXIFLOXACIN HCL 0.5 % OP SOLN: 1.0000 [drp] | Freq: Three times a day (TID) | OPHTHALMIC | 0 refills | Status: AC

## 2022-08-26 MED ORDER — AMOXICILLIN-POT CLAVULANATE 875-125 MG PO TABS: 1.0000 | ORAL_TABLET | Freq: Two times a day (BID) | ORAL | 0 refills | Status: AC

## 2022-08-26 MED ORDER — BENZONATATE 100 MG PO CAPS: 200.0000 mg | ORAL_CAPSULE | Freq: Three times a day (TID) | ORAL | 0 refills | Status: DC

## 2022-08-26 MED ORDER — PROMETHAZINE-DM 6.25-15 MG/5ML PO SYRP: 5.0000 mL | ORAL_SOLUTION | Freq: Four times a day (QID) | ORAL | 0 refills | Status: DC | PRN

## 2022-08-26 NOTE — Discharge Instructions (Addendum)
The Augmentin twice daily with food for 10 days for treatment of your sinusitis.  Perform sinus irrigation 2-3 times a day with a NeilMed sinus rinse kit and distilled water.  Do not use tap water.  You can use plain over-the-counter Mucinex every 6 hours to break up the stickiness of the mucus so your body can clear it.  Increase your oral fluid intake to thin out your mucus so that is also able for your body to clear more easily.  Take an over-the-counter probiotic, such as Culturelle-align-activia, 1 hour after each dose of antibiotic to prevent diarrhea.  Instill 1 drop of Vigamox in each eye every 8 hours for the next 7 days for treatment of your conjunctivitis.  Avoid touching your eyes as much as possible.  Wipe down all surfaces, countertops, and doorknobs after the first and second 24 hours on eyedrops.  Wash her face with a clean wash rag to remove any drainage and use a different portion of the wash rag to clean each eye so as to not reinfect yourself.  Use the Atrovent nasal spray, 2 squirts in each nostril every 6 hours, as needed for runny nose and postnasal drip.  Use the Tessalon Perles every 8 hours during the day.  Take them with a small sip of water.  They may give you some numbness to the base of your tongue or a metallic taste in your mouth, this is normal.  Use the Promethazine DM cough syrup at bedtime for cough and congestion.  It will make you drowsy so do not take it during the day.  If you develop any new or worsening symptoms return for reevaluation or see your primary care provider.

## 2022-08-26 NOTE — ED Provider Notes (Signed)
[facility]    CSN: [id number] Arrival date & time: 08/26/22  1245      History   Chief Complaint Chief Complaint  Patient presents with   Eye Problem    Sinus and eye infection - Entered by patient   Cough   Eye Drainage    HPI Calvin Burns is a 62 y.o. male.   HPI  59 old male with a past medical history significant for GERD and asthma presents for evaluation of respiratory symptoms that started a week ago.  He reports nasal congestion with sinus pressure and green nasal discharge.  He does endorse that he had a sore throat at the start of his symptoms but that has resolved.  He does have a cough that is productive for a yellow-green sputum with the development of shortness of breath and wheezing in the last day.  He denies fever, ear pain, or GI symptoms.  His daughter was recently diagnosed with conjunctivitis and yesterday he developed some eye irritation and woke up this morning with yellow-green mucopurulent discharge, mild itching, and left eye redness.  Past Medical History:  Diagnosis Date   Arthritis    Asthma    Complication of anesthesia    Spinal headache that resulted in a blood patch.   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   GERD (gastroesophageal reflux disease) 11/11/2018   Mild intermittent asthma 11/11/2018   Primary osteoarthritis of right knee 08/14/2017   IGT (impaired glucose tolerance) 04/12/2017   Family hx of colon cancer 09/06/2016   Dysphagia 05/23/2011   Asthma 05/23/2011    Past Surgical History:  Procedure Laterality Date   COLONOSCOPY     X 2   COLONOSCOPY N/A 01/10/2017   Procedure: COLONOSCOPY;  Surgeon: Malissa Hippo, MD;  Location: [facility];  Service: Endoscopy;  Laterality: N/A;  9:55    ESOPHAGOGASTRODUODENOSCOPY     1 yr ago for dysphagia   ESOPHAGOGASTRODUODENOSCOPY (EGD) WITH ESOPHAGEAL DILATION  01/10/2012   Procedure: ESOPHAGOGASTRODUODENOSCOPY (EGD) WITH ESOPHAGEAL DILATION;  Surgeon: Malissa Hippo, MD;  Location: [facility];  Service: Endoscopy;  Laterality: N/A;  930   Hemorlroids     14 yr ago.   HEMORRHOID SURGERY     KNEE ARTHROPLASTY Left 11/12/2018   Procedure: COMPUTER ASSISTED TOTAL KNEE ARTHROPLASTY;  Surgeon: Donato Heinz, MD;  Location: [facility];  Service: Orthopedics;  Laterality: Left;   KNEE ARTHROPLASTY Right 03/23/2019   Procedure: COMPUTER ASSISTED TOTAL KNEE ARTHROPLASTY;  Surgeon: Donato Heinz, MD;  Location: [facility];  Service: Orthopedics;  Laterality: Right;   POLYPECTOMY  01/10/2017   Procedure: POLYPECTOMY;  Surgeon: Malissa Hippo, MD;  Location: [facility];  Service: Endoscopy;;  colon   TONSILLECTOMY         Home Medications    Prior to Admission medications   Medication Sig Start Date End Date Taking? Authorizing Provider  albuterol (VENTOLIN HFA) 108 (90 Base) MCG/ACT inhaler Inhale 1-2 puffs into the lungs every 6 (six) hours as needed for shortness of breath. Shortness of Breath 11/21/21  Yes Brimage, Vondra, DO  amoxicillin-clavulanate (AUGMENTIN) 875-125 MG tablet Take 1 tablet by mouth every 12 (twelve) hours for 10 days. 08/26/22 09/05/22  Yes Becky Augusta, NP  benzonatate (TESSALON) 100 MG capsule Take 2 capsules (200 mg total) by mouth every 8 (eight) hours. 08/26/22  Yes Becky Augusta, NP  fluticasone (FLONASE) 50 MCG/ACT nasal spray Place 1 spray into both nostrils daily. 08/18/21  Yes Lamptey, Britta Mccreedy, MD  Fluticasone-Salmeterol (ADVAIR) 250-50 MCG/DOSE AEPB Inhale 1 puff into the lungs every evening. 11/21/21  Yes Brimage, Vondra, DO  ipratropium (ATROVENT) 0.06 % nasal spray Place 2 sprays into both nostrils 4 (four) times daily. 08/26/22  Yes Becky Augusta, NP  moxifloxacin (VIGAMOX) 0.5 % ophthalmic solution Place  1 drop into both eyes 3 (three) times daily for 7 days. 08/26/22 09/02/22 Yes Becky Augusta, NP  pantoprazole (PROTONIX) 40 MG tablet Take 1 tablet (40 mg total) by mouth daily. Last seen 2018. Due for yearly refills. 01/20/20  Yes Ardelia Mems, PA-C  promethazine-dextromethorphan (PROMETHAZINE-DM) 6.25-15 MG/5ML syrup Take 5 mLs by mouth 4 (four) times daily as needed. 08/26/22  Yes Becky Augusta, NP  propranolol (INDERAL) 10 MG tablet Take 1 tablet by mouth 2 (two) times daily. 10/30/21  Yes [provider]  Tetrahydrozoline HCl (VISINE OP) Place 1 drop into both eyes 3 (three) times daily as needed (dry eyes).    Yes [provider]  valACYclovir (VALTREX) 1000 MG tablet Take by mouth. 04/19/22  Yes [provider]    Family History Family History  Problem Relation Age of Onset   Colon cancer Other     Social History Social History   Tobacco Use   Smoking status: Never   Smokeless tobacco: Never  Vaping Use   Vaping Use: Never used  Substance Use Topics   Alcohol use: Yes    Alcohol/week: 28.0 standard drinks of alcohol    Types: 28 Cans of beer per week    Comment: 3 beer a day   Drug use: Yes    Types: Marijuana, Cocaine    Comment: pt report no use of cocaine for 5 years     Allergies   Patient has no known allergies.   Review of Systems Review of Systems  Constitutional:  Negative for fever.  HENT:  Positive for congestion, rhinorrhea, sinus pressure and sore throat. Negative for ear pain.   Eyes:  Positive for discharge, redness and itching.  Respiratory:  Positive for cough, shortness of breath and wheezing.   Gastrointestinal:  Negative for diarrhea, nausea and vomiting.     Physical Exam Triage Vital Signs ED Triage Vitals  Enc Vitals Group     BP 08/26/22 1337 (!) 145/89     Pulse Rate 08/26/22 1337 74     Resp 08/26/22 1337 18     Temp 08/26/22 1337 99 F (37.2 C)     Temp src --      SpO2 08/26/22 1337 98 %     Weight --       Height --      Head Circumference --      Peak Flow --      Pain Score 08/26/22 1334 0     Pain Loc --      Pain Edu? --      Excl. in GC? --    No data found.  Updated Vital Signs BP (!) 145/89   Pulse 74   Temp 99 F (37.2 C)   Resp 18   SpO2 98%   Visual Acuity Right Eye Distance:   Left Eye Distance:   Bilateral Distance:  Right Eye Near:   Left Eye Near:    Bilateral Near:     Physical Exam Vitals and nursing note reviewed.  Constitutional:      Appearance: Normal appearance. He is not ill-appearing.  HENT:     Head: Normocephalic and atraumatic.     Right Ear: Tympanic membrane, ear canal and external ear normal. There is no impacted cerumen.     Left Ear: Tympanic membrane, ear canal and external ear normal. There is no impacted cerumen.     Nose: Congestion and rhinorrhea present.     Comments: His mucosa is erythematous and edematous with thick purulent discharge in both nares.  Patient does have tenderness to compression of bilateral maxillary sinuses.    Mouth/Throat:     Mouth: Mucous membranes are moist.     Pharynx: Oropharynx is clear. Posterior oropharyngeal erythema present. No oropharyngeal exudate.     Comments: Patient has erythema and injection to the posterior oropharynx with yellow postnasal drip. Eyes:     General:        Left eye: Discharge present.    Extraocular Movements: Extraocular movements intact.     Pupils: Pupils are equal, round, and reactive to light.     Comments: Bulbar and labral conjunctiva of the left eye are erythematous and injected.  Pupils equal round reactive and EOMs intact.  Patient has normal red light reflex in the left eye.  He does have yellow-green mucopurulent discharge in the outer canthus.  Cardiovascular:     Rate and Rhythm: Normal rate and regular rhythm.     Pulses: Normal pulses.     Heart sounds: Normal heart sounds. No murmur heard.    No friction rub. No gallop.  Pulmonary:     Effort:  Pulmonary effort is normal.     Breath sounds: Normal breath sounds. No wheezing, rhonchi or rales.  Musculoskeletal:     Cervical back: Normal range of motion and neck supple.  Lymphadenopathy:     Cervical: No cervical adenopathy.  Skin:    General: Skin is warm and dry.     Capillary Refill: Capillary refill takes less than 2 seconds.     Findings: No rash.  Neurological:     Mental Status: He is alert.      UC Treatments / Results  Labs (all labs ordered are listed, but only abnormal results are displayed) Labs Reviewed - No data to display  EKG   Radiology No results found.  Procedures Procedures (including critical care time)  Medications Ordered in UC Medications - No data to display  Initial Impression / Assessment and Plan / UC Course  I have reviewed the triage vital signs and the nursing notes.  Pertinent labs & imaging results that were available during my care of the patient were reviewed by me and considered in my medical decision making (see chart for details).   Patient is a pleasant, nontoxic-appearing 62 year old male presenting for evaluation of ocular and respiratory complaints as outlined in HPI above.  He does have a history of asthma and endorses shortness of breath and wheezing in the last day though he is able to speak in full sentence without dyspnea or tachypnea.  Cardiopulmonary exam reveals clear lung sounds in all fields.  His upper respiratory tract is inflamed with purulent discharge in both nares as well as in the posterior oropharynx.  I will treat him for maxillary sinusitis with Augmentin 875 twice daily for 10 days.  Also sinus irrigation.  Atrovent nasal spray, Tessalon Perles, and Promethazine DM cough syrup help with cough and congestion.  His left eye does demonstrate conjunctivitis and it is difficult to tell if this is secondary to exposure from his daughter who has conjunctivitis or if it is secondary to his sinusitis and URI symptoms.   I will start him on Vigamox 1 drop 3 times daily for a week to cover for bacterial conjunctivitis.  Return precautions reviewed.   Final Clinical Impressions(s) / UC Diagnoses   Final diagnoses:  Acute bacterial conjunctivitis of left eye  Acute non-recurrent maxillary sinusitis     Discharge Instructions      The Augmentin twice daily with food for 10 days for treatment of your sinusitis.  Perform sinus irrigation 2-3 times a day with a [employer] sinus rinse kit and distilled water.  Do not use tap water.  You can use plain over-the-counter Mucinex every 6 hours to break up the stickiness of the mucus so your body can clear it.  Increase your oral fluid intake to thin out your mucus so that is also able for your body to clear more easily.  Take an over-the-counter probiotic, such as Culturelle-align-activia, 1 hour after each dose of antibiotic to prevent diarrhea.  Instill 1 drop of Vigamox in each eye every 8 hours for the next 7 days for treatment of your conjunctivitis.  Avoid touching your eyes as much as possible.  Wipe down all surfaces, countertops, and doorknobs after the first and second 24 hours on eyedrops.  Wash her face with a clean wash rag to remove any drainage and use a different portion of the wash rag to clean each eye so as to not reinfect yourself.  Use the Atrovent nasal spray, 2 squirts in each nostril every 6 hours, as needed for runny nose and postnasal drip.  Use the Tessalon Perles every 8 hours during the day.  Take them with a small sip of water.  They may give you some numbness to the base of your tongue or a metallic taste in your mouth, this is normal.  Use the Promethazine DM cough syrup at bedtime for cough and congestion.  It will make you drowsy so do not take it during the day.  If you develop any new or worsening symptoms return for reevaluation or see your primary care provider.      ED Prescriptions     Medication Sig Dispense  Auth. Provider   amoxicillin-clavulanate (AUGMENTIN) 875-125 MG tablet Take 1 tablet by mouth every 12 (twelve) hours for 10 days. 20 tablet Becky Augusta, NP   ipratropium (ATROVENT) 0.06 % nasal spray Place 2 sprays into both nostrils 4 (four) times daily. 15 mL Becky Augusta, NP   benzonatate (TESSALON) 100 MG capsule Take 2 capsules (200 mg total) by mouth every 8 (eight) hours. 21 capsule Becky Augusta, NP   promethazine-dextromethorphan (PROMETHAZINE-DM) 6.25-15 MG/5ML syrup Take 5 mLs by mouth 4 (four) times daily as needed. 118 mL Becky Augusta, NP   moxifloxacin (VIGAMOX) 0.5 % ophthalmic solution Place 1 drop into both eyes 3 (three) times daily for 7 days. 3 mL Becky Augusta, NP      PDMP not reviewed this encounter.   Becky Augusta, NP 08/26/22 1351

## 2022-08-26 NOTE — ED Triage Notes (Signed)
Pt reports eye drainage started this morning. Pt has had a runny nose and sinus infection for over a week.

## 2023-04-21 ENCOUNTER — Ambulatory Visit: Payer: Self-pay

## 2023-12-13 ENCOUNTER — Encounter (INDEPENDENT_AMBULATORY_CARE_PROVIDER_SITE_OTHER): Payer: Self-pay | Admitting: *Deleted

## 2023-12-28 ENCOUNTER — Ambulatory Visit
Admission: RE | Admit: 2023-12-28 | Discharge: 2023-12-28 | Disposition: A | Discharge status: Home / Self Care | Payer: Self-pay | Source: Ambulatory Visit

## 2023-12-28 VITALS — BP 137/96 | HR 74 | Temp 98.0°F | Resp 16 | Ht 74.0 in | Wt 170.0 lb

## 2023-12-28 DIAGNOSIS — J069 Acute upper respiratory infection, unspecified: Secondary | ICD-10-CM

## 2023-12-28 MED ORDER — PROMETHAZINE-DM 6.25-15 MG/5ML PO SYRP: 5.0000 mL | ORAL_SOLUTION | Freq: Four times a day (QID) | ORAL | 0 refills | Status: DC | PRN

## 2023-12-28 MED ORDER — IPRATROPIUM BROMIDE 0.06 % NA SOLN: 2.0000 | Freq: Four times a day (QID) | NASAL | 12 refills | Status: DC

## 2023-12-28 MED ORDER — ALBUTEROL SULFATE HFA 108 (90 BASE) MCG/ACT IN AERS: 1.0000 | INHALATION_SPRAY | Freq: Four times a day (QID) | RESPIRATORY_TRACT | 1 refills | Status: AC | PRN

## 2023-12-28 MED ORDER — AEROCHAMBER MV MISC: 2 refills | Status: AC

## 2023-12-28 MED ORDER — BENZONATATE 100 MG PO CAPS: 200.0000 mg | ORAL_CAPSULE | Freq: Three times a day (TID) | ORAL | 0 refills | Status: DC

## 2023-12-28 NOTE — Discharge Instructions (Addendum)
 As we discussed, your exam is consistent with upper respiratory tract infection I believe the postnasal drip is what is triggering her cough.  Use the albuterol  inhaler, with the spacer, and take 1 to 2 puffs every 4-6 hours as needed for any shortness of breath or wheezing.  Take the Augmentin  875 mg twice daily with food for 7 days for treatment of your upper respiratory tract infection.  You may use over-the-counter Tylenol  and or ibuprofen according to the package instructions as needed for any fever or pain.  Use the Atrovent  nasal spray, 2 squirts in each nostril every 6 hours, as needed for runny nose and postnasal drip.  Use the Tessalon  Perles every 8 hours during the day.  Take them with a small sip of water .  They may give you some numbness to the base of your tongue or a metallic taste in your mouth, this is normal.  Use the Promethazine  DM cough syrup at bedtime for cough and congestion.  It will make you drowsy so do not take it during the day.  Return for reevaluation or see your primary care provider for any new or worsening symptoms.

## 2023-12-28 NOTE — ED Triage Notes (Signed)
Patient c/o productive cough and chest congestion for over a week.  Patient denies fevers.

## 2023-12-28 NOTE — ED Provider Notes (Signed)
 [facility]    CSN: [id number] Arrival date & time: 12/28/23  1135      History   Chief Complaint Chief Complaint  Patient presents with   Cough    Appointment    HPI Calvin Burns is a 63 y.o. male.   HPI  39 old male with past medical history significant for dysphagia, asthma, GERD, arthritis, and impaired glucose tolerance presents for evaluation of 1 week with respiratory symptoms that include runny nose, nasal congestion, productive cough yellow sputum, shortness breath, and wheezing.  He denies any fever, sore throat, or ear pain.  Past Medical History:  Diagnosis Date   Arthritis    Asthma    Complication of anesthesia    Spinal headache that resulted in a blood patch.   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   GERD (gastroesophageal reflux disease) 11/11/2018   Mild intermittent asthma 11/11/2018   Primary osteoarthritis of right knee 08/14/2017   IGT (impaired glucose tolerance) 04/12/2017   Family hx of colon cancer 09/06/2016   Dysphagia 05/23/2011   Asthma 05/23/2011    Past Surgical History:  Procedure Laterality Date   COLONOSCOPY     X 2   COLONOSCOPY N/A 01/10/2017   Procedure: COLONOSCOPY;  Surgeon: Golda Claudis PENNER, MD;  Location: [facility];  Service: Endoscopy;  Laterality: N/A;  9:55   ESOPHAGOGASTRODUODENOSCOPY     1 yr ago for dysphagia   ESOPHAGOGASTRODUODENOSCOPY (EGD) WITH ESOPHAGEAL DILATION  01/10/2012   Procedure: ESOPHAGOGASTRODUODENOSCOPY (EGD) WITH ESOPHAGEAL DILATION;  Surgeon: Claudis PENNER Golda, MD;  Location: [facility];  Service: Endoscopy;  Laterality: N/A;  930   Hemorlroids     14 yr ago.   HEMORRHOID SURGERY     KNEE ARTHROPLASTY Left 11/12/2018   Procedure: COMPUTER ASSISTED TOTAL  KNEE ARTHROPLASTY;  Surgeon: Mardee Lynwood SQUIBB, MD;  Location: [facility];  Service: Orthopedics;  Laterality: Left;   KNEE ARTHROPLASTY Right 03/23/2019   Procedure: COMPUTER ASSISTED TOTAL KNEE ARTHROPLASTY;  Surgeon: Mardee Lynwood SQUIBB, MD;  Location: [facility];  Service: Orthopedics;  Laterality: Right;   POLYPECTOMY  01/10/2017   Procedure: POLYPECTOMY;  Surgeon: Golda Claudis PENNER, MD;  Location: [facility];  Service: Endoscopy;;  colon   TONSILLECTOMY         Home Medications    Prior to Admission medications   Medication Sig Start Date End Date Taking? Authorizing Provider  amoxicillin -clavulanate (AUGMENTIN ) 875-125 MG tablet Take 1 tablet by mouth every 12 (twelve) hours for 7 days. 12/28/23 01/04/24 Yes Bernardino Ditch, NP  benzonatate  (TESSALON ) 100 MG capsule Take 2 capsules (200 mg total) by mouth every 8 (eight) hours. 12/28/23  Yes Bernardino Ditch, NP  ipratropium (ATROVENT ) 0.06 % nasal spray Place 2 sprays into both nostrils 4 (four) times daily. 12/28/23  Yes Bernardino Ditch, NP  losartan (COZAAR) 25 MG tablet Take 25 mg by mouth daily.   Yes [provider]  pantoprazole  (PROTONIX ) 40 MG tablet Take 1 tablet (40 mg total) by mouth daily. Last seen 2018. Due for yearly refills. 01/20/20  Yes Mevelyn Arabia B, PA-C  promethazine -dextromethorphan (PROMETHAZINE -DM) 6.25-15 MG/5ML syrup Take 5 mLs by mouth 4 (four) times daily as needed. 12/28/23  Yes Bernardino Ditch,  NP  Spacer/Aero-Holding [place] (AEROCHAMBER MV) inhaler Use as instructed 12/28/23  Yes Bernardino Ditch, NP  albuterol  (VENTOLIN  HFA) 108 (90 Base) MCG/ACT inhaler Inhale 1-2 puffs into the lungs every 6 (six) hours as needed for shortness of breath. Shortness of Breath 12/28/23   Bernardino Ditch, NP  fluticasone  (FLONASE ) 50 MCG/ACT nasal spray Place 1 spray into both nostrils daily. 08/18/21   Blaise Aleene KIDD, MD  Fluticasone -Salmeterol (ADVAIR) 250-50 MCG/DOSE AEPB Inhale 1 puff into the lungs every evening. 11/21/21    Brimage, Vondra, DO  sildenafil (VIAGRA) 50 MG tablet Take 50 mg by mouth daily.    [provider]  Tetrahydrozoline HCl (VISINE OP) Place 1 drop into both eyes 3 (three) times daily as needed (dry eyes).     [provider]  valACYclovir (VALTREX) 1000 MG tablet Take by mouth. 04/19/22   [provider]    Family History Family History  Problem Relation Age of Onset   Colon cancer Other     Social History Social History   Tobacco Use   Smoking status: Never   Smokeless tobacco: Never  Vaping Use   Vaping status: Never Used  Substance Use Topics   Alcohol use: Yes    Alcohol/week: 28.0 standard drinks of alcohol    Types: 28 Cans of beer per week    Comment: 3 beer a day   Drug use: Yes    Types: Marijuana, Cocaine    Comment: pt report no use of cocaine for 5 years     Allergies   Patient has no known allergies.   Review of Systems Review of Systems  Constitutional:  Negative for fever.  HENT:  Positive for congestion and rhinorrhea. Negative for ear pain and sore throat.   Respiratory:  Positive for cough, shortness of breath and wheezing.      Physical Exam Triage Vital Signs ED Triage Vitals [12/28/23 1149]  Encounter Vitals Group     BP      Girls Systolic BP Percentile      Girls Diastolic BP Percentile      Boys Systolic BP Percentile      Boys Diastolic BP Percentile      Pulse      Resp      Temp      Temp src      SpO2      Weight 170 lb (77.1 kg)     Height 6' 2 (1.88 m)     Head Circumference      Peak Flow      Pain Score 0     Pain Loc      Pain Education      Exclude from Growth Chart    No data found.  Updated Vital Signs BP (!) 137/96 (BP Location: Right Arm)   Pulse 74   Temp 98 F (36.7 C) (Oral)   Resp 16   Ht 6' 2 (1.88 m)   Wt 170 lb (77.1 kg)   SpO2 97%   BMI 21.83 kg/m   Visual Acuity Right Eye Distance:   Left Eye Distance:   Bilateral Distance:    Right Eye Near:   Left Eye  Near:    Bilateral Near:     Physical Exam Vitals and nursing note reviewed.  Constitutional:      Appearance: Normal appearance. He is not ill-appearing.  HENT:     Head: Normocephalic and atraumatic.     Right Ear: Tympanic membrane, ear canal  and external ear normal. There is no impacted cerumen.     Left Ear: Tympanic membrane, ear canal and external ear normal. There is no impacted cerumen.     Nose: Congestion and rhinorrhea present.     Comments: Nasal mucosa is edematous and erythematous with yellow discharge in both naris.    Mouth/Throat:     Mouth: Mucous membranes are moist.     Pharynx: Oropharynx is clear. Posterior oropharyngeal erythema present. No oropharyngeal exudate.     Comments: Erythema to the posterior pharynx with yellow postnasal drip. Cardiovascular:     Rate and Rhythm: Normal rate and regular rhythm.     Pulses: Normal pulses.     Heart sounds: Normal heart sounds. No murmur heard.    No friction rub. No gallop.  Pulmonary:     Effort: Pulmonary effort is normal.     Breath sounds: Wheezing present. No rhonchi or rales.     Comments: Expiratory wheezing in bilateral upper lung fields.  Middle and lower lung fields are clear to auscultation. Musculoskeletal:     Cervical back: Normal range of motion and neck supple. No tenderness.  Lymphadenopathy:     Cervical: No cervical adenopathy.  Skin:    General: Skin is warm and dry.     Capillary Refill: Capillary refill takes less than 2 seconds.     Findings: No rash.  Neurological:     General: No focal deficit present.     Mental Status: He is alert and oriented to person, place, and time.      UC Treatments / Results  Labs (all labs ordered are listed, but only abnormal results are displayed) Labs Reviewed - No data to display  EKG   Radiology No results found.  Procedures Procedures (including critical care time)  Medications Ordered in UC Medications - No data to display  Initial  Impression / Assessment and Plan / UC Course  I have reviewed the triage vital signs and the nursing notes.  Pertinent labs & imaging results that were available during my care of the patient were reviewed by me and considered in my medical decision making (see chart for details).   Patient is a very pleasant, nontoxic-appearing 63 year old male presenting for evaluation of 1 week of respiratory symptoms as outlined in HPI above.  On exam he does have inflammation of his upper respiratory tract as evidenced by inflamed nasal mucosa with yellow nasal discharge.  He also has erythema to the posterior pharynx with postnasal drip.  His lung sounds demonstrate expiratory wheezing in the upper lung fields bilaterally but his middle lung fields and bases are clear.  I suspect that the patient cough is being driven by postnasal drip.  I will discharge him home with a diagnosis of URI with cough and congestion on Augmentin  and return send mammograms twice daily with food for a week.  Atrovent  nasal spray for nasal congestion and Tessalon  Perles and Promethazine  DM cough syrup for cough and congestion.  I will also prescribe an albuterol  inhaler and spacer and he can use 1 to 2 puffs every 4-6 hours as needed for shortness of breath or wheezing.   Final Clinical Impressions(s) / UC Diagnoses   Final diagnoses:  URI with cough and congestion     Discharge Instructions      As we discussed, your exam is consistent with upper respiratory tract infection I believe the postnasal drip is what is triggering her cough.  Use the albuterol  inhaler, with  the spacer, and take 1 to 2 puffs every 4-6 hours as needed for any shortness of breath or wheezing.  Take the Augmentin  875 mg twice daily with food for 7 days for treatment of your upper respiratory tract infection.  You may use over-the-counter Tylenol  and or ibuprofen according to the package instructions as needed for any fever or pain.  Use the Atrovent   nasal spray, 2 squirts in each nostril every 6 hours, as needed for runny nose and postnasal drip.  Use the Tessalon  Perles every 8 hours during the day.  Take them with a small sip of water .  They may give you some numbness to the base of your tongue or a metallic taste in your mouth, this is normal.  Use the Promethazine  DM cough syrup at bedtime for cough and congestion.  It will make you drowsy so do not take it during the day.  Return for reevaluation or see your primary care provider for any new or worsening symptoms.      ED Prescriptions     Medication Sig Dispense Auth. Provider   albuterol  (VENTOLIN  HFA) 108 (90 Base) MCG/ACT inhaler Inhale 1-2 puffs into the lungs every 6 (six) hours as needed for shortness of breath. Shortness of Breath 6.7 g Bernardino Ditch, NP   Spacer/Aero-Holding Chambers (AEROCHAMBER MV) inhaler Use as instructed 1 each Bernardino Ditch, NP   amoxicillin -clavulanate (AUGMENTIN ) 875-125 MG tablet Take 1 tablet by mouth every 12 (twelve) hours for 7 days. 14 tablet Bernardino Ditch, NP   benzonatate  (TESSALON ) 100 MG capsule Take 2 capsules (200 mg total) by mouth every 8 (eight) hours. 21 capsule Bernardino Ditch, NP   ipratropium (ATROVENT ) 0.06 % nasal spray Place 2 sprays into both nostrils 4 (four) times daily. 15 mL Bernardino Ditch, NP   promethazine -dextromethorphan (PROMETHAZINE -DM) 6.25-15 MG/5ML syrup Take 5 mLs by mouth 4 (four) times daily as needed. 118 mL Bernardino Ditch, NP      PDMP not reviewed this encounter.   Bernardino Ditch, NP 12/28/23 1200

## 2024-02-01 ENCOUNTER — Encounter: Payer: Self-pay | Admitting: Emergency Medicine

## 2024-02-01 ENCOUNTER — Ambulatory Visit
Admission: EM | Admit: 2024-02-01 | Discharge: 2024-02-01 | Disposition: A | Discharge status: Home / Self Care | Attending: Emergency Medicine | Admitting: Emergency Medicine

## 2024-02-01 DIAGNOSIS — J4531 Mild persistent asthma with (acute) exacerbation: Secondary | ICD-10-CM

## 2024-02-01 DIAGNOSIS — R051 Acute cough: Secondary | ICD-10-CM

## 2024-02-01 DIAGNOSIS — J069 Acute upper respiratory infection, unspecified: Secondary | ICD-10-CM | POA: Diagnosis not present

## 2024-02-01 MED ORDER — PREDNISONE 20 MG PO TABS: 60.0000 mg | ORAL_TABLET | Freq: Every day | ORAL | 0 refills | Status: AC

## 2024-02-01 MED ORDER — BENZONATATE 100 MG PO CAPS: 200.0000 mg | ORAL_CAPSULE | Freq: Three times a day (TID) | ORAL | 0 refills | Status: AC

## 2024-02-01 MED ORDER — IPRATROPIUM BROMIDE 0.06 % NA SOLN: 2.0000 | Freq: Four times a day (QID) | NASAL | 12 refills | Status: AC

## 2024-02-01 MED ORDER — PROMETHAZINE-DM 6.25-15 MG/5ML PO SYRP
5.0000 mL | ORAL_SOLUTION | Freq: Four times a day (QID) | ORAL | 0 refills | Status: AC | PRN
Start: 2024-02-01 — End: ?

## 2024-02-01 MED ORDER — AMOXICILLIN-POT CLAVULANATE 875-125 MG PO TABS: 1.0000 | ORAL_TABLET | Freq: Two times a day (BID) | ORAL | 0 refills | Status: AC

## 2024-02-01 NOTE — ED Triage Notes (Signed)
 Patient c/o cough, congestion, sinus pressure and drainage that started a week ago.  Patient denies fevers.

## 2024-02-01 NOTE — ED Provider Notes (Signed)
 [facility]    CSN: [id number] Arrival date & time: 02/01/24  9162      History   Chief Complaint Chief Complaint  Patient presents with   Sinus Problem   Cough    HPI LESLEE HAUETER is a 63 y.o. male.   HPI  63 year old male with past medical history significant for asthma, GERD, dysphagia, migraine headaches, and environmental and seasonal allergies presents for evaluation of 1 week with respiratory symptoms that include nasal congestion and sinus pressure with yellow nasal discharge, intermittently productive cough with yellow sputum, shortness breath, and wheezing.  No fever.  He has been using his inhaler more than usual but he states that it is helping.  Past Medical History:  Diagnosis Date   Arthritis    Asthma    Complication of anesthesia    Spinal headache that resulted in a blood patch.   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   GERD (gastroesophageal reflux disease) 11/11/2018   Mild intermittent asthma 11/11/2018   Primary osteoarthritis of right knee 08/14/2017   IGT (impaired glucose tolerance) 04/12/2017   Family hx of colon cancer 09/06/2016   Dysphagia 05/23/2011   Asthma 05/23/2011    Past Surgical History:  Procedure Laterality Date   COLONOSCOPY     X 2   COLONOSCOPY N/A 01/10/2017   Procedure: COLONOSCOPY;  Surgeon: Golda Claudis PENNER, MD;  Location: [facility];  Service: Endoscopy;  Laterality: N/A;  9:55   ESOPHAGOGASTRODUODENOSCOPY     1 yr ago for dysphagia   ESOPHAGOGASTRODUODENOSCOPY (EGD) WITH ESOPHAGEAL DILATION  01/10/2012   Procedure: ESOPHAGOGASTRODUODENOSCOPY (EGD) WITH ESOPHAGEAL DILATION;  Surgeon: Claudis PENNER Golda, MD;  Location: [facility];  Service: Endoscopy;  Laterality: N/A;  930    Hemorlroids     14 yr ago.   HEMORRHOID SURGERY     KNEE ARTHROPLASTY Left 11/12/2018   Procedure: COMPUTER ASSISTED TOTAL KNEE ARTHROPLASTY;  Surgeon: Mardee Lynwood SQUIBB, MD;  Location: [facility];  Service: Orthopedics;  Laterality: Left;   KNEE ARTHROPLASTY Right 03/23/2019   Procedure: COMPUTER ASSISTED TOTAL KNEE ARTHROPLASTY;  Surgeon: Mardee Lynwood SQUIBB, MD;  Location: [facility];  Service: Orthopedics;  Laterality: Right;   POLYPECTOMY  01/10/2017   Procedure: POLYPECTOMY;  Surgeon: Golda Claudis PENNER, MD;  Location: [facility];  Service: Endoscopy;;  colon   TONSILLECTOMY         Home Medications    Prior to Admission medications   Medication Sig Start Date End Date Taking? Authorizing Provider  amoxicillin -clavulanate (AUGMENTIN ) 875-125 MG tablet Take 1 tablet by mouth every 12 (twelve) hours for 7 days. 02/01/24 02/08/24 Yes Bernardino Ditch, NP  predniSONE  (DELTASONE ) 20 MG tablet Take 3 tablets (60 mg total) by mouth daily with breakfast for 5 days. 3 tablets daily for 5 days. 02/01/24 02/06/24 Yes Bernardino Ditch, NP  albuterol  (VENTOLIN  HFA) 108 (90 Base) MCG/ACT inhaler Inhale 1-2 puffs into the lungs every 6 (six) hours as needed for shortness of breath. Shortness of Breath 12/28/23   Bernardino Ditch, NP  benzonatate  (TESSALON ) 100 MG capsule Take 2 capsules (200 mg total) by mouth every 8 (eight) hours. 02/01/24   Bernardino Ditch, NP  fluticasone  (FLONASE ) 50 MCG/ACT nasal spray Place  1 spray into both nostrils daily. 08/18/21   LampteyAleene KIDD, MD  Fluticasone -Salmeterol (ADVAIR) 250-50 MCG/DOSE AEPB Inhale 1 puff into the lungs every evening. 11/21/21   Brimage, Vondra, DO  ipratropium (ATROVENT ) 0.06 % nasal spray Place 2 sprays into both nostrils 4 (four) times daily. 02/01/24   Bernardino Ditch, NP  losartan (COZAAR) 25 MG tablet Take 25 mg by mouth daily.    [provider]  pantoprazole  (PROTONIX ) 40 MG tablet Take 1 tablet (40 mg total) by mouth daily. Last seen 2018. Due for yearly  refills. 01/20/20   Mevelyn Romero NOVAK, PA-C  promethazine -dextromethorphan (PROMETHAZINE -DM) 6.25-15 MG/5ML syrup Take 5 mLs by mouth 4 (four) times daily as needed. 02/01/24   Bernardino Ditch, NP  sildenafil (VIAGRA) 50 MG tablet Take 50 mg by mouth daily.    [provider]  Spacer/Aero-Holding Chambers (AEROCHAMBER MV) inhaler Use as instructed 12/28/23   Bernardino Ditch, NP  Tetrahydrozoline HCl (VISINE OP) Place 1 drop into both eyes 3 (three) times daily as needed (dry eyes).     [provider]  valACYclovir (VALTREX) 1000 MG tablet Take by mouth. 04/19/22   [provider]    Family History Family History  Problem Relation Age of Onset   Colon cancer Other     Social History Social History   Tobacco Use   Smoking status: Never   Smokeless tobacco: Never  Vaping Use   Vaping status: Never Used  Substance Use Topics   Alcohol use: Yes    Alcohol/week: 28.0 standard drinks of alcohol    Types: 28 Cans of beer per week    Comment: 3 beer a day   Drug use: Yes    Types: Marijuana    Comment: pt report no use of cocaine for 5 years     Allergies   Patient has no known allergies.   Review of Systems Review of Systems  Constitutional:  Negative for fever.  HENT:  Positive for congestion, rhinorrhea and sinus pressure. Negative for ear pain.   Respiratory:  Positive for cough, shortness of breath and wheezing.      Physical Exam Triage Vital Signs ED Triage Vitals  Encounter Vitals Group     BP      Girls Systolic BP Percentile      Girls Diastolic BP Percentile      Boys Systolic BP Percentile      Boys Diastolic BP Percentile      Pulse      Resp      Temp      Temp src      SpO2      Weight      Height      Head Circumference      Peak Flow      Pain Score      Pain Loc      Pain Education      Exclude from Growth Chart    No data found.  Updated Vital Signs BP (!) 140/82 (BP Location: Left Arm)   Pulse 81   Temp 98.1 F  (36.7 C) (Oral)   Resp 15   Ht 6' 2 (1.88 m)   Wt 169 lb 15.6 oz (77.1 kg)   SpO2 99%   BMI 21.82 kg/m   Visual Acuity Right Eye Distance:   Left Eye Distance:   Bilateral Distance:    Right Eye Near:   Left Eye Near:    Bilateral Near:  Physical Exam Vitals and nursing note reviewed.  Constitutional:      Appearance: Normal appearance. He is not ill-appearing.  HENT:     Head: Normocephalic and atraumatic.     Nose: Congestion and rhinorrhea present.     Comments: Patient mucosas mildly edematous with yellow discharge in both nares.    Mouth/Throat:     Mouth: Mucous membranes are moist.     Pharynx: Oropharynx is clear. No oropharyngeal exudate or posterior oropharyngeal erythema.  Cardiovascular:     Rate and Rhythm: Normal rate and regular rhythm.     Pulses: Normal pulses.     Heart sounds: Normal heart sounds. No murmur heard.    No friction rub. No gallop.  Pulmonary:     Effort: Pulmonary effort is normal.     Breath sounds: Normal breath sounds. No wheezing, rhonchi or rales.  Musculoskeletal:     Cervical back: Normal range of motion and neck supple. No tenderness.  Lymphadenopathy:     Cervical: No cervical adenopathy.  Skin:    General: Skin is warm and dry.     Capillary Refill: Capillary refill takes less than 2 seconds.     Findings: No erythema or rash.  Neurological:     General: No focal deficit present.     Mental Status: He is alert and oriented to person, place, and time.      UC Treatments / Results  Labs (all labs ordered are listed, but only abnormal results are displayed) Labs Reviewed - No data to display  EKG   Radiology No results found.  Procedures Procedures (including critical care time)  Medications Ordered in UC Medications - No data to display  Initial Impression / Assessment and Plan / UC Course  I have reviewed the triage vital signs and the nursing notes.  Pertinent labs & imaging results that were  available during my care of the patient were reviewed by me and considered in my medical decision making (see chart for details).   Patient is a pleasant, nontoxic-appearing 63 year old male presenting for evaluation of 1 week worth of respiratory symptoms as outlined in HPI above.  He is endorsing shortness breath and wheezing though he is able to speak in full sentences without dyspnea or tachypnea.  Respiratory rate at triage was 15 with a 99% room air oxygen saturation.  He is afebrile with an oral temp of 98.1.  The patient reports that he last used his inhaler last night but he had to use it 3 times in order to resolve his shortness of breath and wheezing.  Given that he has had symptoms for a week I will treat him for an upper respiratory tract infection with cough and congestion as well as asthma exacerbation.  I will start her on Augmentin  875 mg twice daily with food for 7 days along with Atrovent  nasal spray for the congestion and Tessalon  Perles and Promethazine  DM cough syrup for cough and congestion.  Have advised him to continue using his albuterol  inhaler as needed for shortness breath or wheezing.  Return precautions reviewed.   Final Clinical Impressions(s) / UC Diagnoses   Final diagnoses:  Acute cough  URI with cough and congestion  Mild persistent asthma with exacerbation     Discharge Instructions      Take the Augmentin  twice daily with food for 7 days for treatment of your upper respiratory tract infection.  Continue to use your albuterol  inhaler every 4-6 hours as needed for shortness of  breath or wheezing to combat your asthma exacerbation.  Take your first dose of prednisone  when you pick it up from the pharmacy.  You will take 60 mg daily for 5 days.  Take it at breakfast time going forward.Use the Atrovent  nasal spray, 2 squirts in each nostril every 6 hours, as needed for runny nose and postnasal drip.  Use the Tessalon  Perles every 8 hours during the day.  Take  them with a small sip of water .  They may give you some numbness to the base of your tongue or a metallic taste in your mouth, this is normal.  Use the Promethazine  DM cough syrup at bedtime for cough and congestion.  It will make you drowsy so do not take it during the day.  Return for reevaluation or see your primary care provider for any new or worsening symptoms.      ED Prescriptions     Medication Sig Dispense Auth. Provider   ipratropium (ATROVENT ) 0.06 % nasal spray Place 2 sprays into both nostrils 4 (four) times daily. 15 mL Bernardino Ditch, NP   promethazine -dextromethorphan (PROMETHAZINE -DM) 6.25-15 MG/5ML syrup Take 5 mLs by mouth 4 (four) times daily as needed. 118 mL Bernardino Ditch, NP   benzonatate  (TESSALON ) 100 MG capsule Take 2 capsules (200 mg total) by mouth every 8 (eight) hours. 21 capsule Bernardino Ditch, NP   amoxicillin -clavulanate (AUGMENTIN ) 875-125 MG tablet Take 1 tablet by mouth every 12 (twelve) hours for 7 days. 14 tablet Bernardino Ditch, NP   predniSONE  (DELTASONE ) 20 MG tablet Take 3 tablets (60 mg total) by mouth daily with breakfast for 5 days. 3 tablets daily for 5 days. 15 tablet Bernardino Ditch, NP      PDMP not reviewed this encounter.   Bernardino Ditch, NP 02/01/24 [phone]

## 2024-02-01 NOTE — Discharge Instructions (Addendum)
 Take the Augmentin  twice daily with food for 7 days for treatment of your upper respiratory tract infection.  Continue to use your albuterol  inhaler every 4-6 hours as needed for shortness of breath or wheezing to combat your asthma exacerbation.  Take your first dose of prednisone  when you pick it up from the pharmacy.  You will take 60 mg daily for 5 days.  Take it at breakfast time going forward.Use the Atrovent  nasal spray, 2 squirts in each nostril every 6 hours, as needed for runny nose and postnasal drip.  Use the Tessalon  Perles every 8 hours during the day.  Take them with a small sip of water .  They may give you some numbness to the base of your tongue or a metallic taste in your mouth, this is normal.  Use the Promethazine  DM cough syrup at bedtime for cough and congestion.  It will make you drowsy so do not take it during the day.  Return for reevaluation or see your primary care provider for any new or worsening symptoms.
